# Patient Record
Sex: Female | Born: 1960 | Race: Black or African American | Hispanic: No | Marital: Single | Smoking: Light tobacco smoker
Health system: Southern US, Community
[De-identification: ages and names within clinical notes are randomized; demographics above are authoritative.]

## PROBLEM LIST (undated history)

## (undated) DIAGNOSIS — I10 Essential (primary) hypertension: Secondary | ICD-10-CM

## (undated) DIAGNOSIS — K219 Gastro-esophageal reflux disease without esophagitis: Secondary | ICD-10-CM

## (undated) DIAGNOSIS — E781 Pure hyperglyceridemia: Secondary | ICD-10-CM

## (undated) HISTORY — DX: Gastro-esophageal reflux disease without esophagitis: K21.9

## (undated) HISTORY — DX: Pure hyperglyceridemia: E78.1

## (undated) HISTORY — DX: Essential (primary) hypertension: I10

## (undated) HISTORY — PX: DILATION AND CURETTAGE OF UTERUS: SHX78

## (undated) HISTORY — PX: UMBILICAL HERNIA REPAIR: SHX196

## (undated) HISTORY — PX: TONSILLECTOMY: SHX5217

## (undated) HISTORY — PX: TUBAL LIGATION: SHX77

## (undated) HISTORY — PX: CHOLECYSTECTOMY: SHX55

---
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
## 1981-08-15 HISTORY — PX: CHOLECYSTECTOMY: SHX55

## 2014-08-15 DIAGNOSIS — M545 Low back pain, unspecified: Secondary | ICD-10-CM

## 2014-08-15 HISTORY — DX: Low back pain, unspecified: M54.50

## 2015-08-14 ENCOUNTER — Ambulatory Visit (INDEPENDENT_AMBULATORY_CARE_PROVIDER_SITE_OTHER): Payer: Worker's Compensation | Admitting: Family Medicine

## 2015-08-14 ENCOUNTER — Telehealth: Payer: Self-pay

## 2015-08-14 VITALS — BP 106/68 | HR 60 | Temp 97.6°F | Resp 16 | Ht 58.5 in | Wt 192.6 lb

## 2015-08-14 DIAGNOSIS — S39012A Strain of muscle, fascia and tendon of lower back, initial encounter: Secondary | ICD-10-CM

## 2015-08-14 DIAGNOSIS — M545 Low back pain, unspecified: Secondary | ICD-10-CM

## 2015-08-14 DIAGNOSIS — Y990 Civilian activity done for income or pay: Secondary | ICD-10-CM

## 2015-08-14 MED ORDER — METHOCARBAMOL 500 MG PO TABS: ORAL_TABLET | ORAL | Status: DC

## 2015-08-14 MED ORDER — NAPROXEN 500 MG PO TABS: 500.0000 mg | ORAL_TABLET | Freq: Two times a day (BID) | ORAL | Status: DC

## 2015-08-14 NOTE — Patient Instructions (Signed)
Take the muscle relaxant, methocarbamol, 1 in the morning, 1 in the afternoon, and 2 at bedtime  Take the Anti-inflammatory medication naproxen 500 mg one twice daily at breakfast and supper . If you need something stronger for pain in addition to this you can take Tylenol 500 mg 2 pills 3 times daily  Continue your blood pressure medication  Plan to return in about 4 days for a recheck, sooner if problems or worse.

## 2015-08-14 NOTE — Telephone Encounter (Signed)
Pt is requesting a note to be out of work.. I don't see instructions where she has been taken out of work... Only instrutions to return for re-check within 4 days. PT was advised to return to the office to discuss her request to be out of work with the provider (Dr. Clayborn Heron hours of availability were provided)////PT stated she will call back once she has more information from her employer.  [phone]

## 2015-08-14 NOTE — Progress Notes (Signed)
Patient ID: Mackenzie Garrett, female    DOB: [birth year]  Age: 54 y.o. MRN: [id number]  Chief Complaint  Patient presents with  . 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  She does not have any history of major back problems. This morning she called a bad out from the wall with patient on the bed. She felt immediate strain in her left low back. It hurt pretty significantly, and has continued hurting though maybe it has subsided a little bit. That was about 11:00, so it is now. About 3 hours. She has not had any major radiation of the pain. It just hurts in the left low back around the SI region.  Current allergies, medications, problem list, past/family and social histories reviewed.  Objective:  BP 106/68 mmHg  Pulse 60  Temp(Src) 97.6 F (36.4 C) (Oral)  Resp 16  Ht 4' 10.5" (1.486 m)  Wt 192 lb 9.6 oz (87.363 kg)  BMI 39.56 kg/m2  SpO2 98%  Alert and oriented. Abdomen soft without masses or tenderness. Spine is straight, nontender. No asymmetry noted of the paraspinous muscles. She is not tender on the right. She is not tender on the left until just above the left SI joint which seems to be the area of maximum tenderness, some tenderness down on into the joint area. Range of motion is satisfactory though tilted to the left, bending forward, and extending to cause pain. Rotation is fairly intact. Straight leg raising test negative but has a little pain on the left. Deep tendon reflexes trace and symmetrical. Gait normal.  Assessment & Plan:   Assessment: 1. Lumbar strain, initial encounter   2. Left-sided low back pain without sciatica   3. Work related injury       Plan: Low back strain which will be treated with anti-inflammatory pain relievers and muscle relaxants. She should just work light duty for the next few days until this rests. If there is none available she will need to be left off work for a few days.  No orders of  the defined types were placed in this encounter.    Meds ordered this encounter  Medications  . amLODipine (NORVASC) 10 MG tablet    Sig: Take 10 mg by mouth daily.  . benazepril (LOTENSIN) 10 MG tablet    Sig: Take 10 mg by mouth daily.  . naproxen (NAPROSYN) 500 MG tablet    Sig: Take 1 tablet (500 mg total) by mouth 2 (two) times daily with a meal.    Dispense:  30 tablet    Refill:  0  . methocarbamol (ROBAXIN) 500 MG tablet    Sig: Take 1 in the morning, 1 in the afternoon, and 2 at bedtime for muscle relaxant    Dispense:  30 tablet    Refill:  0         Patient Instructions  Take the muscle relaxant, methocarbamol, 1 in the morning, 1 in the afternoon, and 2 at bedtime  Take the Anti-inflammatory medication naproxen 500 mg one twice daily at breakfast and supper . If you need something stronger for pain in addition to this you can take Tylenol 500 mg 2 pills 3 times daily  Continue your blood pressure medication  Plan to return in about 4 days for a recheck, sooner if problems or worse.      Return in about 4 days (around 08/18/2015).   HOPPER,DAVID,  MD 08/14/2015

## 2015-08-18 ENCOUNTER — Ambulatory Visit (INDEPENDENT_AMBULATORY_CARE_PROVIDER_SITE_OTHER): Payer: Worker's Compensation | Admitting: Internal Medicine

## 2015-08-18 VITALS — BP 126/76 | HR 65 | Temp 98.4°F | Resp 18 | Ht <= 58 in | Wt 191.4 lb

## 2015-08-18 DIAGNOSIS — S39012D Strain of muscle, fascia and tendon of lower back, subsequent encounter: Secondary | ICD-10-CM

## 2015-08-18 NOTE — Progress Notes (Signed)
   Subjective:  By signing my name below, I, Moises Blood, attest that this documentation has been prepared under the direction and in the presence of Tami Lin, MD. Electronically Signed: Moises Blood, [place]. 08/18/2015 , 11:52 AM .  Patient was seen in Room 12 .   Patient ID: Mackenzie Garrett, female    DOB: March 22, 1961, 55 y.o.   MRN: [id number] Chief Complaint  Patient presents with  . Follow-up    for back pain   HPI Mackenzie Garrett is a 55 y.o. female who presents to [facility] for follow up on her back pain on worker's comp. She was seen by Dr. Linna Darner 4 days ago. She was pulling a bed out for a patient while at work. She felt an immediate strain in her left low back and continued to hurt through the day. She denies history of back problems.   Currently, she's feeling better but still feel the pain. It hurts when she gets out of bed sometimes. When she gets out of the car, it hurts getting out if she sat in the car for a long time. She's been taking her medications except the robaxin 2 at bed time because she already feels drowsy from the previous 2 tablets in the day.   She works as a Quarry manager at [employer]. Her work didn't have her scheduled into work last weekend even with her on restrictions.   Allergies  Allergen Reactions  . Erythromycin   . Penicillins   . Percocet [Oxycodone-Acetaminophen]    Review of Systems  Constitutional: Negative for fever, chills and fatigue.  Gastrointestinal: Negative for abdominal pain.  Musculoskeletal: Positive for back pain. Negative for myalgias, joint swelling, arthralgias, gait problem, neck pain and neck stiffness.  Skin: Negative for rash and wound.      Objective:   Physical Exam  Constitutional: She is oriented to person, place, and time. She appears well-developed and well-nourished. No distress.  HENT:  Head: Normocephalic and atraumatic.  Eyes: EOM are normal. Pupils are equal, round, and reactive to light.  Neck: Neck supple.    Cardiovascular: Normal rate.   Pulmonary/Chest: Effort normal. No respiratory distress.  Musculoskeletal: Normal range of motion.  Tender over left lumbosacral extending into left buttock, straight leg raise to 90 degrees on left produces discomfort, sensation and reflexes intact  Neurological: She is alert and oriented to person, place, and time.  Skin: Skin is warm and dry.  Psychiatric: She has a normal mood and affect. Her behavior is normal.  Nursing note and vitals reviewed.  BP 126/76 mmHg  Pulse 65  Temp(Src) 98.4 F (36.9 C) (Oral)  Resp 18  Ht 4\' 10"  (1.473 m)  Wt 191 lb 6.4 oz (86.818 kg)  BMI 40.01 kg/m2  SpO2 97%     Assessment & Plan:  I have completed the patient encounter in its entirety as documented by the scribe, with editing by me where necessary. Rafay Dahan P. Laney Pastor, M.D.  Lumbar strain, subsequent encounter  Ref PT Cont meds Work restrictions  Reck 2 weeks

## 2015-09-01 ENCOUNTER — Ambulatory Visit (INDEPENDENT_AMBULATORY_CARE_PROVIDER_SITE_OTHER): Payer: Worker's Compensation | Admitting: Family Medicine

## 2015-09-01 VITALS — BP 106/70 | HR 68 | Temp 98.2°F | Resp 16 | Ht <= 58 in | Wt 194.4 lb

## 2015-09-01 DIAGNOSIS — S39012D Strain of muscle, fascia and tendon of lower back, subsequent encounter: Secondary | ICD-10-CM | POA: Diagnosis not present

## 2015-09-01 MED ORDER — PREDNISONE 20 MG PO TABS: 20.0000 mg | ORAL_TABLET | Freq: Every day | ORAL | Status: DC

## 2015-09-01 NOTE — Patient Instructions (Signed)
I would like you to stop the Naprosyn and the muscle relaxer. I've ordered a new medicine which can pick up at the pharmacy which is taken once a day.  I would like to see you back in one week. I've written guidelines for work and started the process of referring him to a physical therapist.

## 2015-09-01 NOTE — Progress Notes (Signed)
Subjective:  This chart was scribed for Robyn Haber MD, by [name], at [facility].  This patient was seen in room 9 and the patient's care was started at 9:53 AM.    Patient ID: Mackenzie Garrett, female    DOB: 07/15/1961, 55 y.o.   MRN: [id number] Chief Complaint  Patient presents with   Follow-up    workers comp. injury    HPI  HPI Comments: Mackenzie Garrett is a 55 y.o. female who presents to the [facility] for a follow up regarding her lower back injury which occurred at work in December.  She took the medication (Naproxen and Methocarbamol) she was given here last visit but states that it made her very drowsy.  She has pain in her legs occasionally and feels pain in her back when she lifts her left leg upwards. She has not yet attended any rehab sessions nor has she set anything up.  She was put on weight restrictions but her work wants specific details on what she can and cannot do.  She has difficulty sleeping at times.  Patient takes ibuprofen very rarely.    She was seen here by Dr. Linna Darner and by Dr. Laney Pastor in the past.   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    There are no active problems to display for this patient.  Past Medical History  Diagnosis Date   Hypertension    Past Surgical History  Procedure Laterality Date   Cholecystectomy     Allergies  Allergen Reactions   Erythromycin    Penicillins    Percocet [Oxycodone-Acetaminophen]    Prior to Admission medications   Medication Sig Start Date End Date Taking? Authorizing Provider  amLODipine (NORVASC) 10 MG tablet Take 10 mg by mouth daily.   Yes Historical Provider, MD  benazepril (LOTENSIN) 10 MG tablet Take 10 mg by mouth daily.   Yes Historical Provider, MD  methocarbamol (ROBAXIN) 500 MG tablet Take 1 in the morning, 1 in the afternoon, and 2 at bedtime for muscle relaxant 08/14/15  Yes Posey Boyer,  MD  naproxen (NAPROSYN) 500 MG tablet Take 1 tablet (500 mg total) by mouth 2 (two) times daily with a meal. 08/14/15  Yes Posey Boyer, MD   Social History   Social History   Marital Status: Single    Spouse Name: N/A   Number of Children: N/A   Years of Education: N/A   Occupational History   Not on file.   Social History Main Topics   Smoking status: Current Every Day Smoker   Smokeless tobacco: Not on file   Alcohol Use: Not on file   Drug Use: Not on file   Sexual Activity: Not on file   Other Topics Concern   Not on file   Social History Narrative       Review of Systems  Constitutional: Negative for fever and chills.  Eyes: Negative for pain, redness and itching.  Respiratory: Negative for cough and shortness of breath.   Gastrointestinal: Negative for nausea and vomiting.  Musculoskeletal: Positive for back pain.  Neurological: Negative for syncope and speech difficulty.       Objective:   Physical Exam  Constitutional: She appears well-developed and well-nourished. No distress.  HENT:  Head: Normocephalic and atraumatic.  Eyes: Pupils are equal, round, and reactive to light.  Neck: Normal range of motion.  Pulmonary/Chest: Effort normal. No respiratory  distress.  Musculoskeletal:  Mild straight leg raise and positive on the left.   Skin: Skin is warm and dry.  Psychiatric: She has a normal mood and affect. Her behavior is normal.   Filed Vitals:   09/01/15 0935  BP: 106/70  Pulse: 68  Temp: 98.2 F (36.8 C)  TempSrc: Oral  Resp: 16  Height: 4\' 10"  (1.473 m)  Weight: 194 lb 6.4 oz (88.179 kg)  SpO2: 98%    Knee and ankle reflexes are normal. There is no muscle wasting.      Assessment & Plan:   This chart was scribed in my presence and reviewed by me personally.    ICD-9-CM ICD-10-CM   1. Lumbar strain, subsequent encounter V58.89 S39.012D predniSONE (DELTASONE) 20 MG tablet   847.2  Ambulatory referral to Physical  Therapy   Follow-up one week  Signed, Robyn Haber, MD

## 2015-09-02 ENCOUNTER — Telehealth: Payer: Self-pay

## 2015-09-02 NOTE — Telephone Encounter (Signed)
Waiting on payment of $12.75 for 17 pages from [facility].

## 2015-09-05 NOTE — Telephone Encounter (Signed)
Payment received and records faxed on 09/05/15

## 2015-09-08 ENCOUNTER — Ambulatory Visit (INDEPENDENT_AMBULATORY_CARE_PROVIDER_SITE_OTHER): Payer: Worker's Compensation | Admitting: Family Medicine

## 2015-09-08 VITALS — BP 128/88 | HR 71 | Temp 98.1°F | Ht <= 58 in | Wt 192.4 lb

## 2015-09-08 DIAGNOSIS — S39012D Strain of muscle, fascia and tendon of lower back, subsequent encounter: Secondary | ICD-10-CM

## 2015-09-08 DIAGNOSIS — M5442 Lumbago with sciatica, left side: Secondary | ICD-10-CM | POA: Diagnosis not present

## 2015-09-08 MED ORDER — MELOXICAM 15 MG PO TABS
15.0000 mg | ORAL_TABLET | Freq: Every day | ORAL | Status: DC
Start: 2015-09-08 — End: 2015-10-06

## 2015-09-08 NOTE — Patient Instructions (Addendum)
Multiple messages have been left with your claims adjuster who has not responded to our [employer] assistant. I think you need to go 3 your job and try and tell them to contact the Worker's Comp. Engineer, structural and get things in motion.  You still need physical therapy for your back.  Take meloxicam 15 mg 1 daily for pain and inflammation

## 2015-09-08 NOTE — Progress Notes (Signed)
Patient ID: Mackenzie Garrett, female    DOB: 06-11-1961  Age: 55 y.o. MRN: [id number]  Chief Complaint  Patient presents with  . Follow-up    for back pain - do not feel any better    Subjective:   54 year old lady who had a lumbar strain about 4 weeks ago. I initially saw her, subsequently she has been seen by Dr. Verline Lema and Dr. Laney Pastor. She was last treated with taper of steroids. Medicines have not seemed to help her a lot. She continues to hurt in her left lumbar region and down into the buttock and down the leg at times. She not been begun on physical therapy yet, has not heard back from anybody on that. It appears this the message is at the claims adjuster with nothing having been done about it yet. We will check on that.  Patient has been working sedentary work at the job place. She says the muscle relaxants made her drugged. She is off of them now.  Current allergies, medications, problem list, past/family and social histories reviewed.  Objective:  BP 128/88 mmHg  Pulse 71  Temp(Src) 98.1 F (36.7 C) (Oral)  Ht 4\' 10"  (1.473 m)  Wt 192 lb 6.4 oz (87.272 kg)  BMI 40.22 kg/m2  SpO2 98%  Obese female. Moves slowly. Abdomen soft. Straight leg raising test negative. Tender knot in the spine but in the left SI and buttock regions. Gait is adequate but slow  Assessment & Plan:   Assessment: 1. Lumbar strain, subsequent encounter   2. Left-sided low back pain with left-sided sciatica       Plan: Lumbar strain does not seem to be improving much. Medications have not helped. Awaiting physical therapy referral.  Time was spent calling the claims adjuster's. I Worker's Comp. Warehouse manager has been unable to reach the patient's Worker's Comp. Engineer, structural, the English as a second language teacher, and now has a message in to the supervisor.  No orders of the defined types were placed in this encounter.    Meds ordered this encounter  Medications  . meloxicam (MOBIC) 15 MG tablet     Sig: Take 1 tablet (15 mg total) by mouth daily.    Dispense:  30 tablet    Refill:  0         Patient Instructions  Multiple messages have been left with your claims adjuster who has not responded to our [employer] assistant. I think you need to go 3 your job and try and tell them to contact the Worker's Comp. Engineer, structural and get things in motion.  You still need physical therapy for your back.  Take meloxicam 15 mg 1 daily for pain and inflammation     No Follow-up on file.   HOPPER,DAVID, MD 09/08/2015

## 2015-09-11 DIAGNOSIS — Z0271 Encounter for disability determination: Secondary | ICD-10-CM

## 2015-09-22 ENCOUNTER — Ambulatory Visit (INDEPENDENT_AMBULATORY_CARE_PROVIDER_SITE_OTHER): Payer: Worker's Compensation | Admitting: Family Medicine

## 2015-09-22 VITALS — BP 128/88 | HR 85 | Temp 99.0°F | Resp 16 | Ht <= 58 in | Wt 199.0 lb

## 2015-09-22 DIAGNOSIS — S39012D Strain of muscle, fascia and tendon of lower back, subsequent encounter: Secondary | ICD-10-CM | POA: Diagnosis not present

## 2015-09-22 DIAGNOSIS — M545 Low back pain, unspecified: Secondary | ICD-10-CM

## 2015-09-22 MED ORDER — PREDNISONE 20 MG PO TABS: ORAL_TABLET | ORAL | Status: DC

## 2015-09-22 NOTE — Progress Notes (Signed)
Patient ID: Mackenzie Garrett, female    DOB: Jan 17, 1961  Age: 55 y.o. MRN: [id number]  Chief Complaint  Patient presents with  . Back Pain    injury at work     Subjective:   Patient continues to have a lot of trouble with pain in her low back and left low back. The pain goes down her left leg Toward the knee. She did finally get into physical therapy, had her first time yesterday. She is going to be seeing them twice a week. She is working light duty.  Current allergies, medications, problem list, past/family and social histories reviewed.  Objective:  BP 128/88 mmHg  Pulse 85  Temp(Src) 99 F (37.2 C) (Oral)  Resp 16  Ht 4\' 10"  (1.473 m)  Wt 199 lb (90.266 kg)  BMI 41.60 kg/m2  SpO2 98%  Continues with pain in her back. Walks very slowly with a limp favoring the left leg significantly. She is tender in the low midline of her back at about the L4 to S1 segments. She hurts in the left paraspinous muscles to the left buttock. Significant pain on flexion, extension, and right tilt.  Assessment & Plan:   Assessment: 1. Lumbar strain, subsequent encounter   2. Low back pain potentially associated with radiculopathy       Plan: Low back strain and pain and radiculopathy. Was going to retreat with a higher dose of prednisone, but she says that she didn't tolerate them with pain in her stomach.  No orders of the defined types were placed in this encounter.    Meds ordered this encounter  Medications  . predniSONE (DELTASONE) 20 MG tablet    Sig: Take 3 daily for 2 days, then 2 daily for 2 days, then 1 daily for 2 days. Take after breakfast.    Dispense:  12 tablet    Refill:  0         Patient Instructions  Continue physical therapy  Continue light duty at work  Continue to try and walk more and get more active. Keep doing the exercises that physical therapy teaches you.  Return in 2 weeks     Return in about 2 weeks (around 10/06/2015).   Paulanthony Gleaves, MD  09/22/2015

## 2015-09-22 NOTE — Patient Instructions (Signed)
Continue physical therapy  Continue light duty at work  Continue to try and walk more and get more active. Keep doing the exercises that physical therapy teaches you.  Return in 2 weeks

## 2015-10-06 ENCOUNTER — Ambulatory Visit: Payer: Worker's Compensation

## 2015-10-06 ENCOUNTER — Ambulatory Visit (INDEPENDENT_AMBULATORY_CARE_PROVIDER_SITE_OTHER): Payer: Worker's Compensation | Admitting: Family Medicine

## 2015-10-06 VITALS — BP 132/80 | HR 75 | Temp 98.1°F | Resp 16 | Ht <= 58 in | Wt 197.0 lb

## 2015-10-06 DIAGNOSIS — M545 Low back pain, unspecified: Secondary | ICD-10-CM

## 2015-10-06 DIAGNOSIS — M5442 Lumbago with sciatica, left side: Secondary | ICD-10-CM | POA: Diagnosis not present

## 2015-10-06 DIAGNOSIS — S39012D Strain of muscle, fascia and tendon of lower back, subsequent encounter: Secondary | ICD-10-CM

## 2015-10-06 MED ORDER — MELOXICAM 15 MG PO TABS
15.0000 mg | ORAL_TABLET | Freq: Every day | ORAL | Status: DC
Start: 2015-10-06 — End: 2015-10-27

## 2015-10-06 NOTE — Progress Notes (Signed)
Patient ID: Mackenzie Garrett, female    DOB: 28-Sep-1960  Age: 55 y.o. MRN: [id number]  Chief Complaint  Patient presents with  . Follow-up    workers comp  . Back Pain    states she is doing better but still having pain    Subjective:   Patient continues to have problems with pain in her left low back down into the buttock and thigh sometimes. She has begun physical therapy for a couple weeks now. It is helped a little bit. She has done some activities at the job, but things like standing at [employer] for waiting for copies to come out causes her pain to be worse. She has not had any new injuries. She tried the prednisone again but could not tolerate taking the full course of it. She goes to PT twice a week. She does some light activity around home but lately is more than she sits.   They have not had true light duty for her. She has continued doing many of her previous tests, requires lots of walking on both floors. 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 She is only had a few tasks that were desk type sedentary activity. The stuff she is doing keeps make her back hurt worse.  Current allergies, medications, problem list, past/family and social histories reviewed.  Objective:  BP 132/80 mmHg  Pulse 75  Temp(Src) 98.1 F (36.7 C) (Oral)  Resp 16  Ht 4\' 10"  (1.473 m)  Wt 197 lb (89.359 kg)  BMI 41.18 kg/m2  SpO2 98%  Alert and oriented. Is able to walk satisfactorily but slow and cautiously. She has tenderness in the left low back and down into the upper part of the buttock. She is nontender along the spine. The right-sided back seems normal. Deep tendon reflexes are essentially absent in her knees and ankles. She has negative straight leg raising tests seated. On laying down straight leg raising test causes pain in the back but nonradicular, able to go up to 90.  Assessment & Plan:   Assessment: No diagnosis found.    Plan: Persistent low back strain, needs to continue on  physical therapy. Will proceed on to getting some LS spine x-rays. They need to make referral to a back specialist. X-rays are normal by my interpretation and the radiologist just notes minimal degenerative changes. More changes at L5-S1, which is also a fairly normal process.  Will try and make a referral to back specialist to see if they have any other input. No orders of the defined types were placed in this encounter.    No orders of the defined types were placed in this encounter.         There are no Patient Instructions on file for this visit.   No Follow-up on file.   Mateja Dier, MD 10/06/2015

## 2015-10-06 NOTE — Patient Instructions (Addendum)
Because you received an x-ray today, you will receive an invoice from [facility]. Please contact [facility] at [phone] with questions or concerns regarding your invoice. Our billing staff will not be able to assist you with those questions.   Continue physical therapy.  Referral is being made to orthopedic back specialist when we can get it approved through [employer]  Return in 3 weeks, sooner if needed

## 2015-10-27 ENCOUNTER — Ambulatory Visit (INDEPENDENT_AMBULATORY_CARE_PROVIDER_SITE_OTHER): Payer: Worker's Compensation | Admitting: Family Medicine

## 2015-10-27 VITALS — BP 122/78 | HR 73 | Temp 97.9°F | Resp 16 | Ht 59.0 in | Wt 199.0 lb

## 2015-10-27 DIAGNOSIS — S39012D Strain of muscle, fascia and tendon of lower back, subsequent encounter: Secondary | ICD-10-CM | POA: Diagnosis not present

## 2015-10-27 DIAGNOSIS — M5442 Lumbago with sciatica, left side: Secondary | ICD-10-CM | POA: Diagnosis not present

## 2015-10-27 MED ORDER — MELOXICAM 15 MG PO TABS: 15.0000 mg | ORAL_TABLET | Freq: Every day | ORAL | Status: DC

## 2015-10-27 NOTE — Progress Notes (Signed)
Subjective:  This chart was scribed for Mackenzie Garrett, by [name], at [facility].  This patient was seen in room 2 and the patient's care was started at 11:53 AM.    Patient ID: Mackenzie Garrett, female    DOB: [birth year], 55 y.o.   MRN: [id number] Chief Complaint  Patient presents with  . Follow-up  . Back Pain    HPI  HPI Comments: Mackenzie Garrett is a 55 y.o. female who presents to the [facility] for a follow up of lower back pain due to an injury at work. Works as a Technical brewer at [employer].   Date of injury was 08/14/15 Left lower back strain diagnosed initially. She was treated with Naprosyn 500 mg BID and robaxin.  Was also placed on work restrictions.   Jan 3rd follow up: Feeling better but was still feeling pain. She was drowsy form rhobaxin so she was no longer taking 2 at bed time. Continued on restrictions and referred to PT. Re-checked  Recheck January 17th: She had not seen PT yet at that time. She had pain in legs occasionally still with pain in the back at that time. She was treated with prednisone with a plan to follow up in one week.   Recheck January 24th.  Per that note, medicines had not seemed to help her a lot and she still had pain in her left lumbar area down into the buttock and leg at times. She had not yet seen physical therapy yet at that time. Stopped muscle relaxant due to sedation and was on sedentary work. Plan was still to pursue physical therapy but started Meloxicam 15 mg QD.   Recheck feb 7th, continued pain in left lower back and left lower leg. Discussed higher dose of prednisone but per that note, she did not tolerate due to discomfort in her stomach. Appears she was prescribed prednisone taper again. She did have 1 episode of PT at that visit with plan of seeing them twice a week.   Feb 21st  follow up: Some improvement with physical therapy. Reportedly did not complete the prednisone taper due to  intolerance but undergoing physical therapy twice a week.  Per that note, still completing some tasks at her work with complaints of these activities making her back worse. X ray indicated minimal degenerative changes in the lumbar spine. And was referred to ortho/ back specialist.   She states that her back pain has not changed and she has not received a call about an appointment with orthopedics.  She states that the pain is more on her left lower back to her left buttock and she has less symptoms of pain shooting all the way down her leg but states it does shoot down to her thigh at times.  She feels slightly better (30--40%) after going to physical therapy but states that it starts again shortly after.  She was put on light duty at work in the beginning but is no longer at work as they only allowed her to do this for 45 days. Denies any bowel or bladder incontinence, no weakness. She is complaint with her meloxicam but is unsure if it is fully helping her. She has been to her PT session twice a week for a couple weeks.   Approximately 15 minutes of record review prior to seeing patient.    There are no active problems to display for this patient.  Past Medical History  Diagnosis Date  .  Hypertension    Past Surgical History  Procedure Laterality Date  . Cholecystectomy     Allergies  Allergen Reactions  . Erythromycin   . Penicillins   . Percocet [Oxycodone-Acetaminophen]    Prior to Admission medications   Medication Sig Start Date End Date Taking? Authorizing Provider  amLODipine (NORVASC) 10 MG tablet Take 10 mg by mouth daily.   Yes Historical Provider, Garrett  benazepril (LOTENSIN) 10 MG tablet Take 10 mg by mouth daily.   Yes Historical Provider, Garrett  meloxicam (MOBIC) 15 MG tablet Take 1 tablet (15 mg total) by mouth daily. 10/06/15  Yes Posey Boyer, Garrett   Social History   Social History  . Marital Status: Single    Spouse Name: N/A  . Number of Children: N/A  . Years of  Education: N/A   Occupational History  . Not on file.   Social History Main Topics  . Smoking status: Current Every Day Smoker -- 0.40 packs/day for 34 years    Types: Cigarettes  . Smokeless tobacco: Not on file  . Alcohol Use: Not on file  . Drug Use: Not on file  . Sexual Activity: Not on file   Other Topics Concern  . Not on file   Social History Narrative    Review of Systems  Constitutional: Negative for fever and chills.  Eyes: Negative for pain, redness and itching.  Respiratory: Negative for cough and shortness of breath.   Gastrointestinal: Negative for nausea and vomiting.  Genitourinary: Negative for dysuria, frequency and difficulty urinating.  Musculoskeletal: Positive for back pain.  Neurological: Negative for seizures, syncope and speech difficulty.       Objective:   Physical Exam  Constitutional: She is oriented to person, place, and time. She appears well-developed and well-nourished. No distress.  HENT:  Head: Normocephalic and atraumatic.  Eyes: Pupils are equal, round, and reactive to light.  Neck: Normal range of motion.  Pulmonary/Chest: Effort normal. No respiratory distress.  Musculoskeletal:  No mid line bony tenderness  Tender along the left paraspinals  minimal spasm, primarily just tenderness.  Slight tenderness along the left buttock and sciatic notch.  Flexion to approximately 45 degrees.  Minimal extension.  Guarded with right lateral flexion and left lateral flexion.  Equal rotation but again slightly decreased. guarded but is able to hell and toe walk. Negative straight leg raise with complaints of lower back pain on left side only.   Neurological: She is alert and oriented to person, place, and time. She displays no Babinski's sign on the right side. She displays no Babinski's sign on the left side.  Reflex Scores:      Patellar reflexes are 2+ on the right side and 2+ on the left side.      Achilles reflexes are 2+ on the right  side and 2+ on the left side. Skin: Skin is warm and dry.  Psychiatric: She has a normal mood and affect. Her behavior is normal.    Filed Vitals:   10/27/15 1107  BP: 122/78  Pulse: 73  Temp: 97.9 F (36.6 C)  Resp: 16  Height: 4\' 11"  (1.499 m)  Weight: 199 lb (90.266 kg)         Assessment & Plan:   Brentley Ballantine is a 55 y.o. female Lumbar strain, subsequent encounter - Plan: meloxicam (MOBIC) 15 MG tablet  Left-sided low back pain with left-sided sciatica - Plan: meloxicam (MOBIC) 15 MG tablet L low back strain with intermittent sciatic symptoms  due to injury at work as above. Persistent pain with only transient relief with PT. Ortho eval pending. No appreciated weakness and reflexes intact/equal.   -Minimal relief with prednisone, so will hold on repeating dose at this time.   -continue meloxicam.   -continue physical therapy  -continue restrictions at work - see letter.   -follow up in next 2 weeks if she has not yet seen ortho. Sooner if worse. rtc precautions.  Meds ordered this encounter  Medications  . meloxicam (MOBIC) 15 MG tablet    Sig: Take 1 tablet (15 mg total) by mouth daily.    Dispense:  30 tablet    Refill:  0   Patient Instructions  IF you received an x-ray today, you will receive an invoice from [facility]. Please contact [facility] at [phone] with questions or concerns regarding your invoice.   IF you received labwork today, you will receive an invoice from [facility]. Please contact [facility] at [phone] with questions or concerns regarding your invoice.   Our billing staff will not be able to assist you with questions regarding bills from these companies.  You will be contacted with the lab results as soon as they are available. The fastest way to get your results is to activate your My Chart account. Instructions are located on the last page of this paperwork. If you have not heard from [place]  regarding the results in 2 weeks, please contact this office.  Okay to continue the meloxicam for now as it may be providing some benefit for pain and inflammation, but if you do have side effects with it, let us know. Continue physical therapy, and plan on orthopedic evaluation. I agree with previous evaluation to see orthopedist. Follow-up with [place] in the next 2 weeks if you have not yet seen orthopedics.  If symptoms are not improved by that time, I would consider ordering an MRI of your back. Return here sooner if worsening, or the emergency room if needed.  Return to the clinic or go to the nearest emergency room if any of your symptoms worsen or new symptoms occur.       I personally performed the services described in this documentation, which was scribed in my presence. The recorded information has been reviewed and considered, and addended by me as needed.

## 2015-10-27 NOTE — Patient Instructions (Addendum)
IF you received an x-ray today, you will receive an invoice from [facility]. Please contact [facility] at [phone] with questions or concerns regarding your invoice.   IF you received labwork today, you will receive an invoice from [facility]. Please contact [facility] at [phone] with questions or concerns regarding your invoice.   Our billing staff will not be able to assist you with questions regarding bills from these companies.  You will be contacted with the lab results as soon as they are available. The fastest way to get your results is to activate your My Chart account. Instructions are located on the last page of this paperwork. If you have not heard from [place] regarding the results in 2 weeks, please contact this office.  Okay to continue the meloxicam for now as it may be providing some benefit for pain and inflammation, but if you do have side effects with it, let us know. Continue physical therapy, and plan on orthopedic evaluation. I agree with previous evaluation to see orthopedist. Follow-up with [place] in the next 2 weeks if you have not yet seen orthopedics.  If symptoms are not improved by that time, I would consider ordering an MRI of your back. Return here sooner if worsening, or the emergency room if needed.  Return to the clinic or go to the nearest emergency room if any of your symptoms worsen or new symptoms occur.

## 2015-11-10 ENCOUNTER — Ambulatory Visit (INDEPENDENT_AMBULATORY_CARE_PROVIDER_SITE_OTHER): Payer: Worker's Compensation | Admitting: Physician Assistant

## 2015-11-10 VITALS — BP 125/83 | HR 81 | Temp 98.3°F | Resp 16 | Ht <= 58 in | Wt 199.0 lb

## 2015-11-10 DIAGNOSIS — S39012D Strain of muscle, fascia and tendon of lower back, subsequent encounter: Secondary | ICD-10-CM

## 2015-11-10 DIAGNOSIS — M5442 Lumbago with sciatica, left side: Secondary | ICD-10-CM

## 2015-11-10 NOTE — Patient Instructions (Signed)
     IF you received an x-ray today, you will receive an invoice from [facility]. Please contact [facility] at [phone] with questions or concerns regarding your invoice.   IF you received labwork today, you will receive an invoice from [facility]. Please contact [facility] at [phone] with questions or concerns regarding your invoice.   Our billing staff will not be able to assist you with questions regarding bills from these companies.  You will be contacted with the lab results as soon as they are available. The fastest way to get your results is to activate your My Chart account. Instructions are located on the last page of this paperwork. If you have not heard from us regarding the results in 2 weeks, please contact this office.      

## 2015-11-10 NOTE — Progress Notes (Signed)
[facility] [address] [place] [address] [phone]  Date:  11/10/2015   Name:  Mackenzie Garrett   DOB:  03/15/1961   MRN:  [id number]  PCP:  Andria Frames, MD    Chief Complaint: Follow-up   History of Present Illness:  This is a 55 y.o. female who is presentingfor a follow up of lower back pain due to an injury at work. 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 Left lower back strain diagnosed initially. She was treated with Naprosyn 500 mg BID and robaxin. Was also placed on work restrictions.   Jan 3rd follow up: Feeling better but was still feeling pain. She was drowsy form robaxin so she was no longer taking 2 at bed time. Continued on restrictions and referred to PT.   Recheck January 17th: She had not seen PT yet at that time. She had pain in legs occasionally still with pain in the back at that time. She was treated with prednisone with a plan to follow up in one week.  Recheck January 24th. Per that note, medicines had not seemed to help her a lot and she still had pain in her left lumbar area down into the buttock and leg at times. She had not yet seen physical therapy at that time. Stopped muscle relaxant due to sedation and was on sedentary work. Plan was still to pursue physical therapy but started Meloxicam 15 mg QD.   Recheck feb 7th, continued pain in left lower back and left lower leg. Discussed higher dose of prednisone but per that note, she did not tolerate due to discomfort in her stomach. Appears she was prescribed prednisone taper again. She did have 1 episode of PT at that visit with plan of seeing them twice a week.   Feb 21st follow up: Some improvement with physical therapy. Reportedly did not complete the prednisone taper due to intolerance but undergoing physical therapy twice a week. Per that note, still completing some tasks at her work with complaints of these activities making her back worse. X ray indicated  minimal degenerative changes in the lumbar spine. And was referred to ortho/ back specialist.   March 14th follow up: Back pain not improved and had still not heard about referral to ortho. Still with shooting pain into her left leg, but was only into thigh and no longer going down into foot. She was on light duty in the beginning but out of work at that time since work did not allow her to do light duty for longer than 45 days. She was tolerating meloxicam well but was unsure if helping. She was continuing to do PT twice a week.  Today she is reporting back pain is about the same. Sciatica is now down to left lower buttock. States she is ok for a time but if she bends over it will start all over again. She is finished with her 12 sessions of PT now. Still has not heard about her referral to ortho. She feels if she goes back to work now she will be in bad shape within a day or two.   Review of Systems:  Review of Systems See HPI  There are no active problems to display for this patient.   Prior to Admission medications   Medication Sig Start Date End Date Taking? Authorizing Provider  amLODipine (NORVASC) 10 MG tablet Take 10 mg by mouth daily.   Yes Historical Provider, MD  benazepril (LOTENSIN) 10 MG tablet Take 10 mg by mouth daily.   Yes Historical Provider, MD  meloxicam (MOBIC) 15 MG tablet Take 1 tablet (15 mg total) by mouth daily. 10/27/15  Yes Wendie Agreste, MD    Allergies  Allergen Reactions  . Erythromycin   . Penicillins   . Percocet [Oxycodone-Acetaminophen]     Past Surgical History  Procedure Laterality Date  . Cholecystectomy      Social History  Substance Use Topics  . Smoking status: Current Every Day Smoker -- 0.40 packs/day for 34 years    Types: Cigarettes  . Smokeless tobacco: None  . Alcohol Use: None    Family History  Problem Relation Age of Onset  . Diabetes Mother   . Kidney failure Mother     Medication list has been reviewed and  updated.  Physical Examination:  Physical Exam  Constitutional: She is oriented to person, place, and time. She appears well-developed and well-nourished. No distress.  HENT:  Head: Normocephalic and atraumatic.  Right Ear: Hearing normal.  Left Ear: Hearing normal.  Nose: Nose normal.  Eyes: Conjunctivae and lids are normal. Right eye exhibits no discharge. Left eye exhibits no discharge. No scleral icterus.  Pulmonary/Chest: Effort normal. No respiratory distress.  Musculoskeletal: Normal range of motion.       Lumbar back: She exhibits tenderness. She exhibits normal range of motion.       Back:  Straight leg raise positive on the left Strength and sensation intact  Neurological: She is alert and oriented to person, place, and time. She has normal reflexes.  Skin: Skin is warm, dry and intact. No lesion and no rash noted.  Psychiatric: She has a normal mood and affect. Her speech is normal and behavior is normal. Thought content normal.   BP 125/83 mmHg  Pulse 81  Temp(Src) 98.3 F (36.8 C)  Resp 16  Ht 4\' 10"  (1.473 m)  Wt 199 lb (90.266 kg)  BMI 41.60 kg/m2  Assessment and Plan:  1. Lumbar strain, subsequent encounter 2. Left-sided low back pain with left-sided sciatica Only mild improvement in the past 2 weeks. She is done with PT sessions now. She was referred to ortho over 4 weeks ago and still has not heard about referral from her w/c claim adjustor. We have sent referral to them and on 3/7 contacted them asking for an update and still have not heard anything. She will continue with mobic. Continue with either light duty or OOW, per company protocol. Follow up with ortho or return here on 11/27/15 if still has not been seen.   Benjaman Pott Drenda Freeze, MHS [facility]  11/10/2015

## 2015-11-13 ENCOUNTER — Telehealth: Payer: Self-pay | Admitting: Radiology

## 2015-11-13 NOTE — Telephone Encounter (Signed)
At the patient's request, I made a copy of her x ray on CD, and placed in the pick up drawer.  

## 2017-04-22 IMAGING — CR DG LUMBAR SPINE COMPLETE 4+V
5 series · 5 of 5 positions shown · non-contrast
Comparison: None.

CLINICAL DATA: Back pain for 2 months, no history of trauma

EXAM:
LUMBAR SPINE - COMPLETE 4+ VIEW

[AP (1 of 2)]
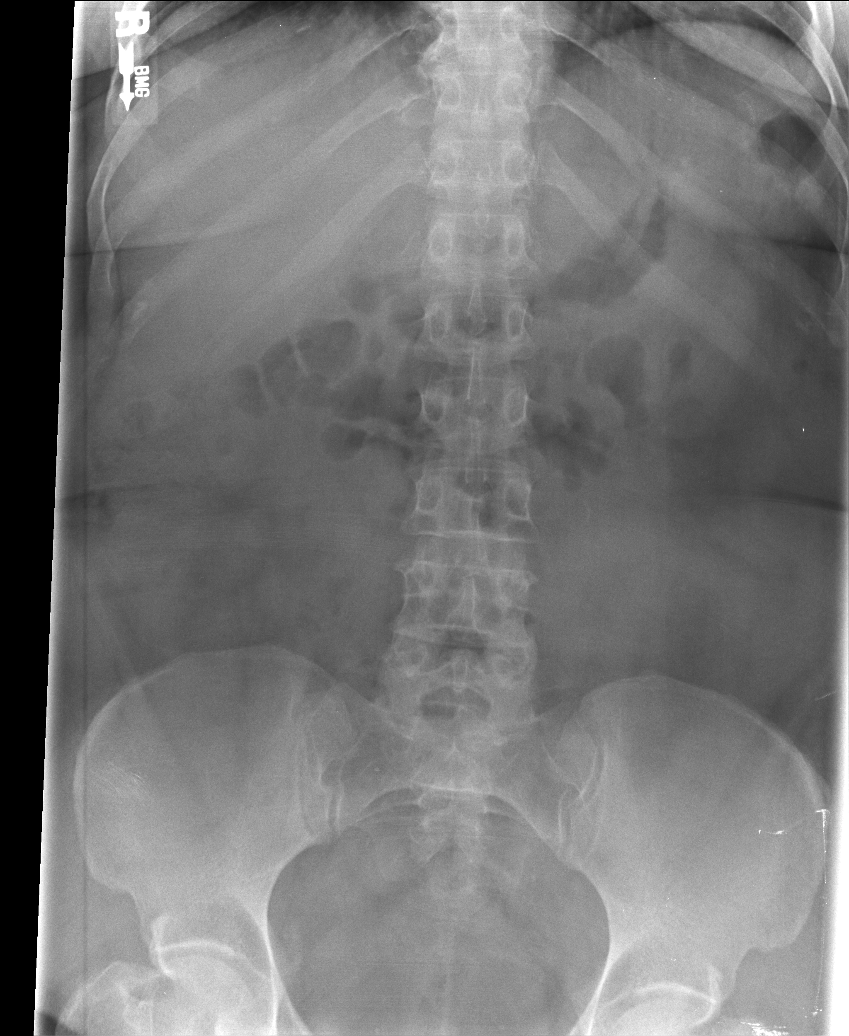

[AP (2 of 2)]
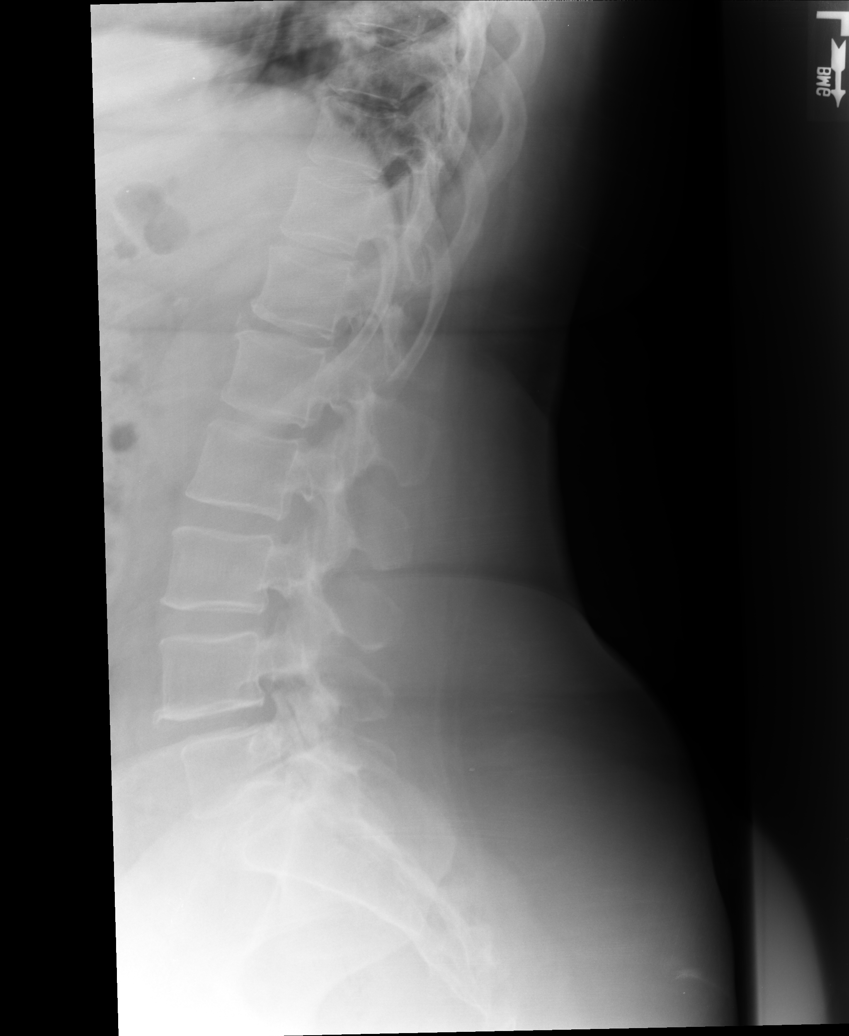

[l5 s1]
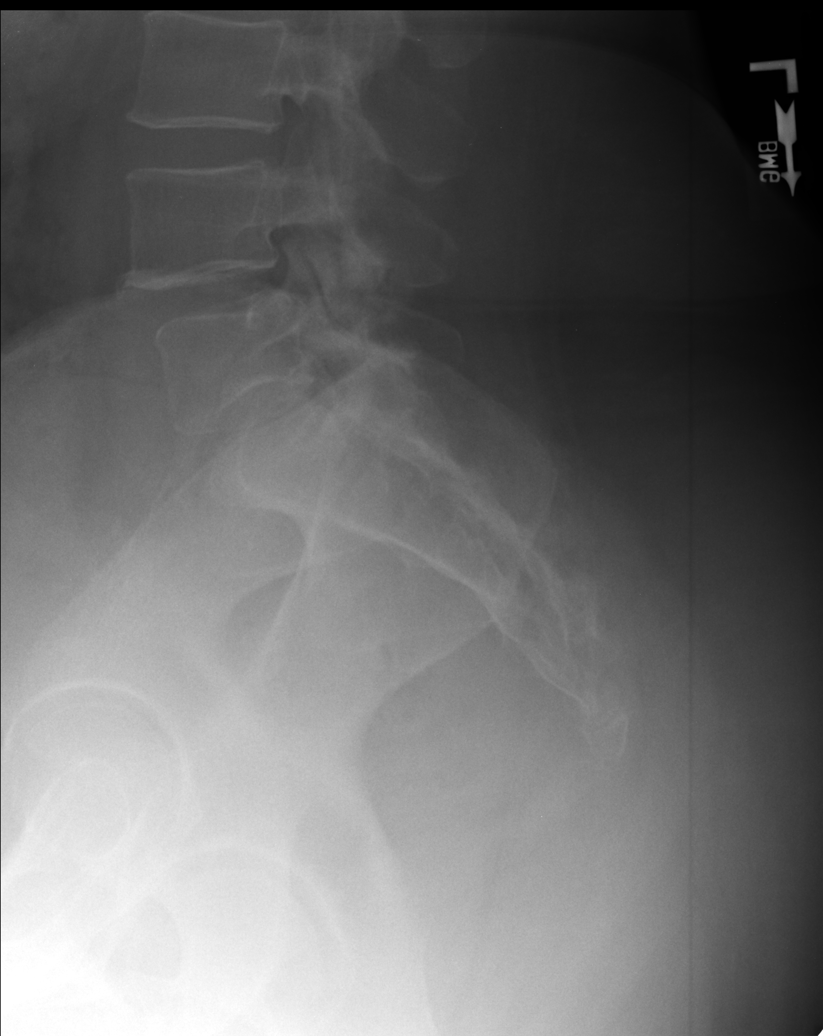

[rpo]
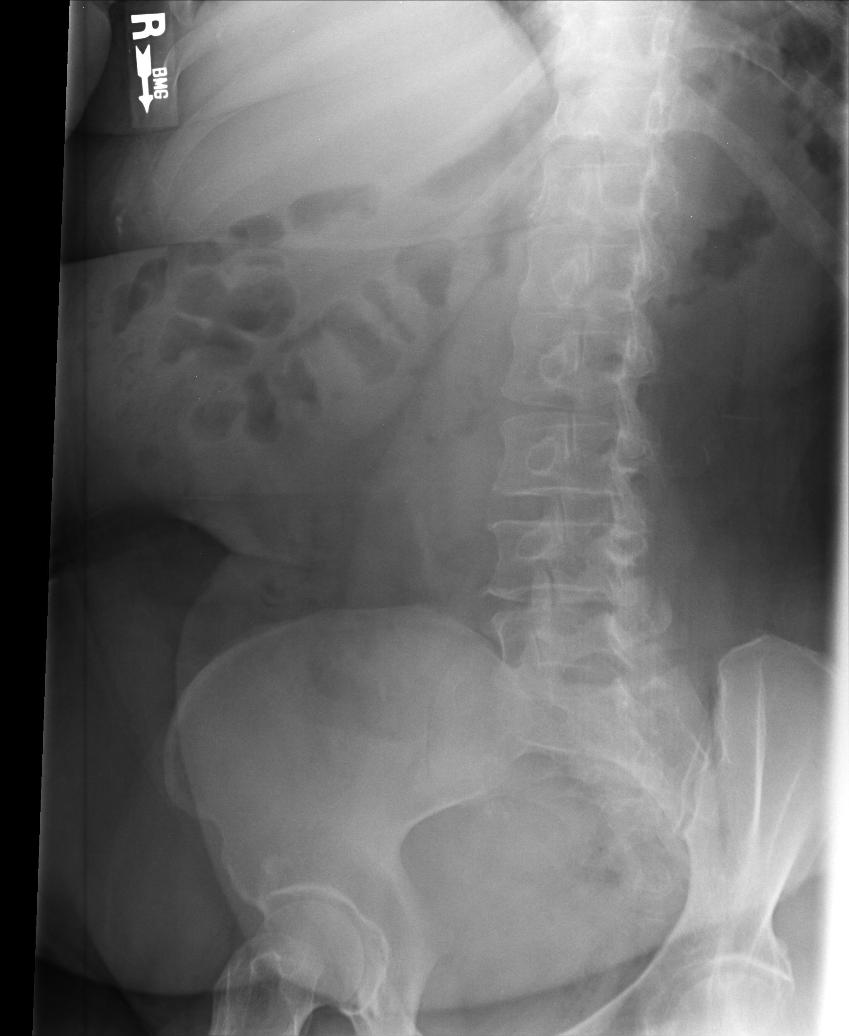

[lpo]
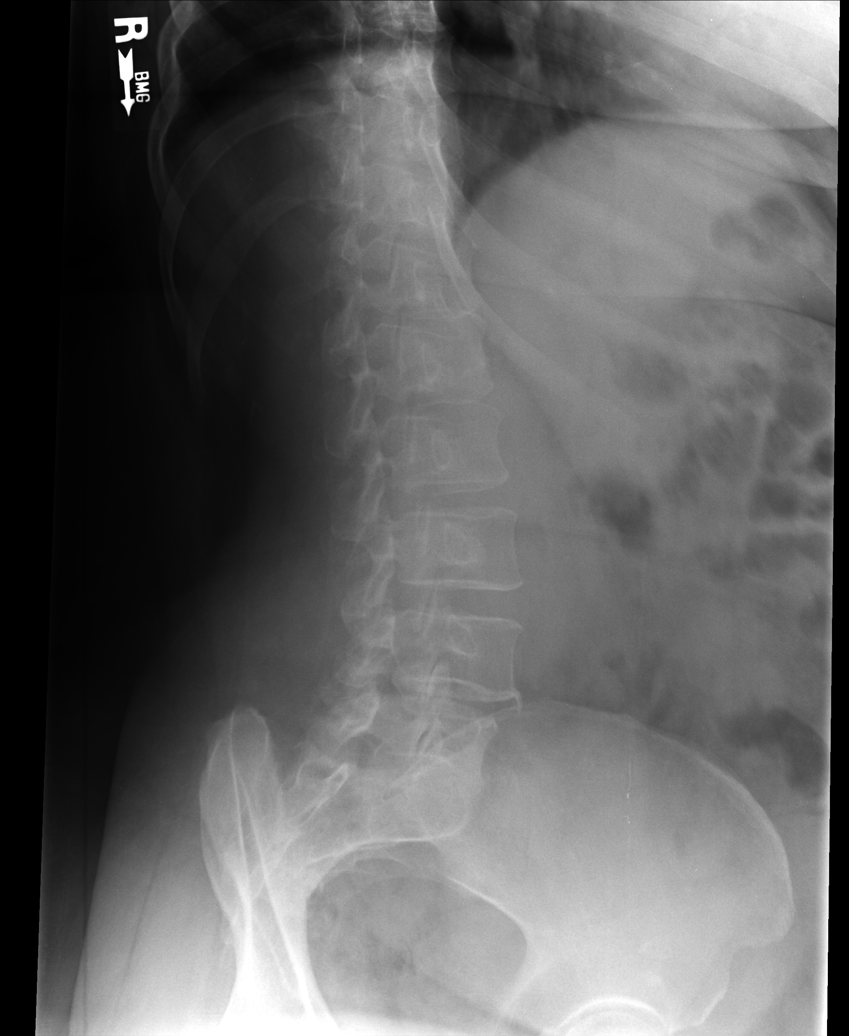

[5 of 5 positions shown; findings below may reference images not displayed]

FINDINGS: The lumbar vertebrae are in normal alignment. There is mild
degenerative disc disease at L4-5 where there is slight loss of disc
space and sclerosis with spurring. The remainder of intervertebral
disc spaces appear normal. There is degenerative change also noted
involving the facet joints at L5-S1. The SI joints are corticated.
The bowel gas pattern is nonspecific.
IMPRESSION: 1. Mild degenerative disc disease at L4-5.  Normal alignment.
2. Degenerative change involves the facet joints of L5-S1.

## 2018-02-21 ENCOUNTER — Ambulatory Visit: Payer: Self-pay

## 2019-06-06 ENCOUNTER — Other Ambulatory Visit: Payer: Self-pay

## 2019-06-06 DIAGNOSIS — Z20822 Contact with and (suspected) exposure to covid-19: Secondary | ICD-10-CM

## 2019-06-08 LAB — NOVEL CORONAVIRUS, NAA: SARS-CoV-2, NAA: NOT DETECTED

## 2019-10-23 ENCOUNTER — Other Ambulatory Visit: Payer: Self-pay

## 2019-10-23 ENCOUNTER — Ambulatory Visit: Payer: Self-pay | Admitting: Internal Medicine

## 2019-10-23 ENCOUNTER — Encounter: Payer: Self-pay | Admitting: Internal Medicine

## 2019-10-23 VITALS — BP 132/84 | HR 72 | Resp 14 | Ht <= 58 in | Wt 221.0 lb

## 2019-10-23 DIAGNOSIS — E781 Pure hyperglyceridemia: Secondary | ICD-10-CM

## 2019-10-23 DIAGNOSIS — I10 Essential (primary) hypertension: Secondary | ICD-10-CM | POA: Insufficient documentation

## 2019-10-23 DIAGNOSIS — G8929 Other chronic pain: Secondary | ICD-10-CM

## 2019-10-23 DIAGNOSIS — M545 Low back pain, unspecified: Secondary | ICD-10-CM | POA: Insufficient documentation

## 2019-10-23 MED ORDER — BENAZEPRIL HCL 10 MG PO TABS: 10.0000 mg | ORAL_TABLET | Freq: Every day | ORAL | 3 refills | Status: DC

## 2019-10-23 MED ORDER — AMLODIPINE BESYLATE 10 MG PO TABS: 10.0000 mg | ORAL_TABLET | Freq: Every day | ORAL | 3 refills | Status: DC

## 2019-10-23 MED ORDER — FENOFIBRATE 145 MG PO TABS: 145.0000 mg | ORAL_TABLET | Freq: Every day | ORAL | 3 refills | Status: DC

## 2019-10-23 NOTE — Progress Notes (Addendum)
Subjective:    Patient ID: Mackenzie Garrett, female   DOB: [birth year], 59 y.o.   MRN: [id number]   HPI   Here to establish  1.  Hypertension:  Diagnosed 10 years ago.  Controlled much of the time.    2.  Hypertriglyceridemia:  Taking Fenofibrate 145 mg daily for 3 months.  Has not had follow up [place] or liver enzymes. Not really working on how she eats or physical activity.  3.  Right low back pain:  Injured with work/patient care.  She was going to [employer].  Went through PT, but does not really feel it helped.  Has a history of left low back pain and feels this is what they focused on.    Current Meds  Medication Sig  . amLODipine (NORVASC) 10 MG tablet Take 10 mg by mouth daily.  . benazepril (LOTENSIN) 10 MG tablet Take 10 mg by mouth daily.  . fenofibrate (TRICOR) 145 MG tablet Take 145 mg by mouth daily.  . Lido-Capsaicin-Men-Methyl Sal (LIDOPRO) 4-.324-06-10.5 % OINT LidoPro 4 %-27.5 %-0.0325 % topical ointment  apply to affected area TID prn pain   Allergies  Allergen Reactions  . Erythromycin   . Naproxen Nausea Only  . Penicillins   . Percocet [Oxycodone-Acetaminophen]   . Atorvastatin Anxiety   Past Medical History:  Diagnosis Date  . Hypertension   . Hypertriglyceridemia   . Low back pain 2016   2 injuries:  left on first and right on second.  States MR showed bulging disc.  Second injury 10/2018    Past Surgical History:  Procedure Laterality Date  . [place]  . CHOLECYSTECTOMY  1983  . UMBILICAL HERNIA REPAIR     When a baby    Family History  Problem Relation Age of Onset  . Diabetes Mother   . Kidney failure Mother        Due to DM  . Hypertension Mother   . Hyperlipidemia Mother   . Stroke Mother   . Seizures Brother   . Multiple sclerosis Sister   . Cancer Sister        Liver  . Seizures Brother   . COPD Brother   . Diabetes Brother   . Heart disease Brother   . Migraines Daughter   . Asthma Daughter   .  Congenital heart disease Daughter        ?ASD or VSD--closed on its own.  . Polycystic ovary syndrome Daughter     Social History   Socioeconomic History  . Marital status: Single    Spouse name: Not on file  . Number of children: 4  . Years of education: 73  . Highest education level: Not on file  Occupational History  . Occupation: CNA    Comment: No work due to chronic back issues.  Tobacco Use  . Smoking status: Light Tobacco Smoker    Packs/day: 0.25    Years: 34.00    Pack years: 8.50    Types: Cigarettes  . Smokeless tobacco: Never Used  Substance and Sexual Activity  . Alcohol use: Never    Alcohol/week: 0.0 standard drinks  . Drug use: Never  . Sexual activity: Not on file  Other Topics Concern  . Not on file  Social History Narrative   Lives with her son.     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   Social Determinants of Engineer, drilling  Resource Strain: High Risk  . Difficulty of Paying Living Expenses: Hard  Food Insecurity: No Food Insecurity  . Worried About Charity fundraiser in the Last Year: Never true  . Ran Out of Food in the Last Year: Never true  Transportation Needs: No Transportation Needs  . Lack of Transportation (Medical): No  . Lack of Transportation (Non-Medical): No  Physical Activity:   . Days of Exercise per Week:   . Minutes of Exercise per Session:   Stress: No Stress Concern Present  . Feeling of Stress : Only a little  Social Connections:   . Frequency of Communication with Friends and Family:   . Frequency of Social Gatherings with Friends and Family:   . Attends Religious Services:   . Active Member of Clubs or Organizations:   . Attends Archivist Meetings:   Marland Kitchen Marital Status:   Intimate Partner Violence: Not At Risk  . Fear of Current or Ex-Partner: No  . Emotionally Abused: No  . Physically Abused: No  . Sexually Abused: No     Review of Systems    Objective:   BP  132/84 (BP Location: Left Arm, Patient Position: Sitting, Cuff Size: Large)   Pulse 72   Resp 14   Ht 4\' 9"  (1.448 m)   Wt 221 lb (100.2 kg)   BMI 47.82 kg/m   Physical Exam  NAD HEENT:  PERRL, EOMI, TMs pearly gray Neck:  Supple, No adenopathy, no thyromomegaly. Chest:  CTA CV:  RRR with normal S1 and S2, No S3, S4 or murmur.  No carotid bruits.  Carotid, radial and DP pulses normal and equal. Abd:  S, NT, No HSM or mass, + BS Back:  NT over L/S spinous processes.  Mild tenderness of bilateral paraspinous musculature, same L/S level.   Neuro, LE:  Motor 5/5, DTRs 2+/4 throughout.  Gait normal.  Assessment & Plan  1.  Hypertension:  Fair control.  CPM  2.  Hypertriglyceridemia:  Return for fasting labs in 1 week:  FLP, CmP.  3.  Chronic Low back pain:  Encouraged regular water activities.  Release for [facility] and [facility] on [place]--MR of back.

## 2019-10-23 NOTE — Patient Instructions (Addendum)
[facility] [place], [place] [address]  [phone] Hours of Operation Mondays to Thursdays: 8 am to 8 pm Fridays: 9 am to 8 pm Saturdays: 9 am to 1 pm Sundays: Closed  Learn how to swim, walk laps in 3 feet range or get on a bicycle! Learn how to read music!

## 2019-10-24 NOTE — Progress Notes (Signed)
Patient ID: Mackenzie Garrett, female   DOB: Dec 25, 1960, 59 y.o.   MRN: [id number]  Social worker completed SDOH, PHQ-9, and GAD-7 with patient. Patient stated she had concerns about paying her rent, but all the places she has gone to for rental assistance have only been catering to individuals affected by Covid, and her situation is not related to Covid. Patient scored a 3 on the PHQ-9 and a 1 on the GAD-7. Social worker informed patient of counseling services offered if she decides she wants them in the future. Social worker will look into rental assistance programs and get back to patient on findings.

## 2019-11-07 ENCOUNTER — Other Ambulatory Visit (INDEPENDENT_AMBULATORY_CARE_PROVIDER_SITE_OTHER): Payer: Self-pay

## 2019-11-07 ENCOUNTER — Other Ambulatory Visit: Payer: Self-pay

## 2019-11-07 DIAGNOSIS — E781 Pure hyperglyceridemia: Secondary | ICD-10-CM

## 2019-11-07 DIAGNOSIS — I10 Essential (primary) hypertension: Secondary | ICD-10-CM

## 2019-11-08 LAB — COMPREHENSIVE METABOLIC PANEL
ALT: 16 IU/L (ref 0–32)
AST: 24 IU/L (ref 0–40)
Albumin/Globulin Ratio: 1.2 (ref 1.2–2.2)
Albumin: 3.9 g/dL (ref 3.8–4.9)
Alkaline Phosphatase: 87 IU/L (ref 39–117)
BUN/Creatinine Ratio: 15 (ref 9–23)
BUN: 12 mg/dL (ref 6–24)
Bilirubin Total: 0.4 mg/dL (ref 0.0–1.2)
CO2: 22 mmol/L (ref 20–29)
Calcium: 9.1 mg/dL (ref 8.7–10.2)
Chloride: 109 mmol/L — ABNORMAL HIGH (ref 96–106)
Creatinine, Ser: 0.81 mg/dL (ref 0.57–1.00)
GFR calc Af Amer: 93 mL/min/{1.73_m2} (ref >59)
GFR calc non Af Amer: 80 mL/min/{1.73_m2} (ref >59)
Globulin, Total: 3.3 g/dL (ref 1.5–4.5)
Glucose: 77 mg/dL (ref 65–99)
Potassium: 4.2 mmol/L (ref 3.5–5.2)
Sodium: 140 mmol/L (ref 134–144)
Total Protein: 7.2 g/dL (ref 6.0–8.5)

## 2019-11-08 LAB — LIPID PANEL W/O CHOL/HDL RATIO
Cholesterol, Total: 103 mg/dL (ref 100–199)
HDL: 48 mg/dL (ref >39)
LDL Chol Calc (NIH): 37 mg/dL (ref 0–99)
Triglycerides: 97 mg/dL (ref 0–149)
VLDL Cholesterol Cal: 18 mg/dL (ref 5–40)

## 2020-01-12 ENCOUNTER — Other Ambulatory Visit: Payer: Self-pay

## 2020-01-12 ENCOUNTER — Emergency Department (HOSPITAL_BASED_OUTPATIENT_CLINIC_OR_DEPARTMENT_OTHER)
Admission: EM | Admit: 2020-01-12 | Discharge: 2020-01-12 | Disposition: A | Discharge status: Home / Self Care | Payer: Self-pay | Attending: Emergency Medicine | Admitting: Emergency Medicine

## 2020-01-12 ENCOUNTER — Encounter (HOSPITAL_BASED_OUTPATIENT_CLINIC_OR_DEPARTMENT_OTHER): Payer: Self-pay | Admitting: Emergency Medicine

## 2020-01-12 DIAGNOSIS — I10 Essential (primary) hypertension: Secondary | ICD-10-CM | POA: Insufficient documentation

## 2020-01-12 DIAGNOSIS — Y939 Activity, unspecified: Secondary | ICD-10-CM | POA: Insufficient documentation

## 2020-01-12 DIAGNOSIS — Y9259 Other trade areas as the place of occurrence of the external cause: Secondary | ICD-10-CM | POA: Insufficient documentation

## 2020-01-12 DIAGNOSIS — F17210 Nicotine dependence, cigarettes, uncomplicated: Secondary | ICD-10-CM | POA: Insufficient documentation

## 2020-01-12 DIAGNOSIS — S39012A Strain of muscle, fascia and tendon of lower back, initial encounter: Secondary | ICD-10-CM

## 2020-01-12 DIAGNOSIS — Y990 Civilian activity done for income or pay: Secondary | ICD-10-CM | POA: Insufficient documentation

## 2020-01-12 DIAGNOSIS — M67432 Ganglion, left wrist: Secondary | ICD-10-CM

## 2020-01-12 DIAGNOSIS — Z79899 Other long term (current) drug therapy: Secondary | ICD-10-CM | POA: Insufficient documentation

## 2020-01-12 DIAGNOSIS — X500XXA Overexertion from strenuous movement or load, initial encounter: Secondary | ICD-10-CM | POA: Insufficient documentation

## 2020-01-12 MED ORDER — CYCLOBENZAPRINE HCL 10 MG PO TABS
10.0000 mg | ORAL_TABLET | Freq: Two times a day (BID) | ORAL | 0 refills | Status: DC | PRN
Start: 2020-01-12 — End: 2021-04-16

## 2020-01-12 NOTE — ED Provider Notes (Signed)
Mackenzie Garrett Provider Note   CSN: [id number] Arrival date & time: 01/12/20  [id number]     History Chief Complaint  Patient presents with  . Back Pain  . Wrist Pain    Mackenzie Garrett is a 59 y.o. female.  Patient with history of high blood pressure, disc herniation, recurrent back pain from lifting on the job presents with worsening right lower back pain worse with movement and position.  No focal neurologic deficits, no fevers or chills.  No new injuries.  Patient also has pain in the left wrist.  Patient denies infectious symptoms.        Past Medical History:  Diagnosis Date  . Hypertension   . Hypertriglyceridemia   . Low back pain 2016   2 injuries:  left on first and right on second.  States MR showed bulging disc.  Second injury 10/2018    Patient Active Problem List   Diagnosis Date Noted  . Essential hypertension 10/23/2019  . Hypertriglyceridemia 10/23/2019  . Low back pain     Past Surgical History:  Procedure Laterality Date  . [place]  . CHOLECYSTECTOMY  1983  . UMBILICAL HERNIA REPAIR     When a baby     OB History   No obstetric history on file.     Family History  Problem Relation Age of Onset  . Diabetes Mother   . Kidney failure Mother        Due to DM  . Hypertension Mother   . Hyperlipidemia Mother   . Stroke Mother   . Seizures Brother   . Multiple sclerosis Sister   . Cancer Sister        Liver  . Seizures Brother   . COPD Brother   . Diabetes Brother   . Heart disease Brother   . Migraines Daughter   . Asthma Daughter   . Congenital heart disease Daughter        ?ASD or VSD--closed on its own.  . Polycystic ovary syndrome Daughter     Social History   Tobacco Use  . Smoking status: Light Tobacco Smoker    Packs/day: 0.25    Years: 34.00    Pack years: 8.50    Types: Cigarettes  . Smokeless tobacco: Never Used  Substance Use Topics  . Alcohol use: Never   Alcohol/week: 0.0 standard drinks  . Drug use: Never    Home Medications Prior to Admission medications   Medication Sig Start Date End Date Taking? Authorizing Provider  amLODipine (NORVASC) 10 MG tablet Take 1 tablet (10 mg total) by mouth daily. 10/23/19   Mack Hook, MD  benazepril (LOTENSIN) 10 MG tablet Take 1 tablet (10 mg total) by mouth daily. 10/23/19   Mack Hook, MD  cyclobenzaprine (FLEXERIL) 10 MG tablet Take 1 tablet (10 mg total) by mouth 2 (two) times daily as needed for muscle spasms. 01/12/20   Elnora Morrison, MD  fenofibrate (TRICOR) 145 MG tablet Take 1 tablet (145 mg total) by mouth daily. 10/23/19   Mack Hook, MD  Lido-Capsaicin-Men-Methyl Sal (LIDOPRO) 4-.324-06-10.5 % OINT LidoPro 4 %-27.5 %-0.0325 % topical ointment  apply to affected area TID prn pain    [provider]  meloxicam (MOBIC) 15 MG tablet Take 1 tablet (15 mg total) by mouth daily. Patient not taking: Reported on 10/23/2019 10/27/15   Wendie Agreste, MD    Allergies    Erythromycin, Naproxen, Penicillins, Percocet [oxycodone-acetaminophen], and Atorvastatin  Review of Systems   Review of Systems  Constitutional: Negative for chills and fever.  HENT: Negative for congestion.   Eyes: Negative for visual disturbance.  Respiratory: Negative for shortness of breath.   Cardiovascular: Negative for chest pain.  Gastrointestinal: Negative for abdominal pain and vomiting.  Genitourinary: Negative for dysuria and flank pain.  Musculoskeletal: Positive for back pain. Negative for neck pain and neck stiffness.  Skin: Negative for rash.  Neurological: Negative for weakness, light-headedness and headaches.    Physical Exam Updated Vital Signs BP (!) 142/79 (BP Location: Right Arm)   Pulse 81   Temp 98 F (36.7 C)   Resp 18   SpO2 100%   Physical Exam Vitals and nursing note reviewed.  Constitutional:      Appearance: She is well-developed.  HENT:     Head:  Normocephalic and atraumatic.  Eyes:     General:        Right eye: No discharge.        Left eye: No discharge.     Conjunctiva/sclera: Conjunctivae normal.  Neck:     Trachea: No tracheal deviation.  Cardiovascular:     Rate and Rhythm: Normal rate.  Pulmonary:     Effort: Pulmonary effort is normal.  Abdominal:     General: There is no distension.     Palpations: Abdomen is soft.     Tenderness: There is no abdominal tenderness. There is no guarding.  Musculoskeletal:        General: Tenderness present. No swelling or signs of injury.     Cervical back: Normal range of motion.     Comments: Patient has paraspinal lumbar tenderness no midline tenderness, no external sign of infection or rash.  Patient has mild tenderness and cyst palpated ganglion left wrist palmar aspect without sign of infection.  Mild pain with flexion extension.  No effusion to joint.  Neurovascularly intact.  Patient has 5+ strength flexion extension of hips knees and ankles bilateral lower extremity, sensation intact to major nerves to palpation.  Skin:    General: Skin is warm.     Findings: No rash.  Neurological:     Mental Status: She is alert and oriented to person, place, and time.     ED Results / Procedures / Treatments   Labs (all labs ordered are listed, but only abnormal results are displayed) Labs Reviewed - No data to display  EKG None  Radiology No results found.  Procedures Procedures (including critical care time)  Medications Ordered in ED Medications - No data to display  ED Course  I have reviewed the triage vital signs and the nursing notes.  Pertinent labs & imaging results that were available during my care of the patient were reviewed by me and considered in my medical decision making (see chart for details).    MDM Rules/Calculators/A&P                      Patient presents with 2 separate concerns.  Patient has acute on chronic lower back pain without new injury,  no neurologic deficit, no fevers.  Discussed supportive care and follow-up for physical therapy and further evaluation.  No indication for emergent imaging at this time.  Patient is clinically ganglion cyst left wrist discussed supportive care and also follow-up with the same sports med/Ortho docs.  Patient unfortunately says she does not have insurance and I asked her to call [facility]/the office to discuss options to help  her. Final Clinical Impression(s) / ED Diagnoses Final diagnoses:  Acute myofascial strain of lumbar region, initial encounter  Ganglion cyst of wrist, left    Rx / DC Orders ED Discharge Orders         Ordered    cyclobenzaprine (FLEXERIL) 10 MG tablet  2 times daily PRN     01/12/20 [id number]           Elnora Morrison, MD 01/12/20 [phone]

## 2020-01-12 NOTE — Discharge Instructions (Addendum)
Use Tylenol and ibuprofen as needed for pain. Use Flexeril as needed for muscle spasm. You can use ice regularly every 3-4 hours while awake for 10 to 15 minutes at a time. Follow-up with sports medicine doctor and primary doctor as discussed for further work-up, physical therapy and treatment options. Return to the emergency room for significant weakness, difficulty with bowel or bladder, fevers or new concerns.

## 2020-01-12 NOTE — ED Triage Notes (Signed)
Pt here with recurring right sided back pain from an old injury and new left wrist pain. Mechanism of injury to wrist is unknown.

## 2020-01-24 ENCOUNTER — Ambulatory Visit: Payer: Self-pay | Admitting: Internal Medicine

## 2020-02-24 ENCOUNTER — Other Ambulatory Visit: Payer: Self-pay

## 2020-02-24 DIAGNOSIS — E781 Pure hyperglyceridemia: Secondary | ICD-10-CM

## 2020-02-25 LAB — LIPID PANEL W/O CHOL/HDL RATIO
Cholesterol, Total: 137 mg/dL (ref 100–199)
HDL: 38 mg/dL — ABNORMAL LOW (ref >39)
LDL Chol Calc (NIH): 69 mg/dL (ref 0–99)
Triglycerides: 177 mg/dL — ABNORMAL HIGH (ref 0–149)
VLDL Cholesterol Cal: 30 mg/dL (ref 5–40)

## 2020-02-27 ENCOUNTER — Other Ambulatory Visit: Payer: Self-pay

## 2020-03-18 ENCOUNTER — Telehealth (INDEPENDENT_AMBULATORY_CARE_PROVIDER_SITE_OTHER): Payer: Self-pay | Admitting: Internal Medicine

## 2020-03-18 ENCOUNTER — Other Ambulatory Visit: Payer: Self-pay

## 2020-03-18 DIAGNOSIS — Z6841 Body Mass Index (BMI) 40.0 and over, adult: Secondary | ICD-10-CM

## 2020-03-18 DIAGNOSIS — I10 Essential (primary) hypertension: Secondary | ICD-10-CM

## 2020-03-18 DIAGNOSIS — E781 Pure hyperglyceridemia: Secondary | ICD-10-CM

## 2020-03-18 DIAGNOSIS — G8929 Other chronic pain: Secondary | ICD-10-CM

## 2020-03-18 DIAGNOSIS — M67432 Ganglion, left wrist: Secondary | ICD-10-CM

## 2020-03-18 DIAGNOSIS — M5441 Lumbago with sciatica, right side: Secondary | ICD-10-CM

## 2020-03-18 NOTE — Progress Notes (Signed)
    Subjective:    Patient ID: Mackenzie Garrett, female   DOB: 29-Oct-1960, 59 y.o.   MRN: [id number]   HPI   Virtual Visit with [facility]  1.  Hypertension:  Has not had bp checked since last here.  She would be okay with dropping in at no charge for BP check with [facility].   Tolerating bp meds fine.  2.  Hypertriglyceridemia:  Off Fenofibrate.  While her cholesterol is not as good as while taking it, she isn't really at a level to strongly recommend medication. She is not working on physical activity with water or cycling as discussed last visit.  3.  Right low back pain with radiculopathy:  Rediscussed MR of back--this was only done with her old left low back pain in 2017 and have not been able to get a copy of it--should be in [facility] [place] where she states it was done.  She has not had MR of spine since her right back injury.  Was following with [facility] and going through PT, but did not help.   She gets pain in right low back with radiation down right buttock to lateral thigh--more of numbness and tingling.  No weakness.  4.  Left wrist knot:  Volar aspect of left wrist--radial side.  Has noted a compressible knot there.  Hurts at times.  Using her cock up splint helps.    Current Meds  Medication Sig  . amLODipine (NORVASC) 10 MG tablet Take 1 tablet (10 mg total) by mouth daily.  . benazepril (LOTENSIN) 10 MG tablet Take 1 tablet (10 mg total) by mouth daily.  . Lido-Capsaicin-Men-Methyl Sal (LIDOPRO) 4-.324-06-10.5 % OINT LidoPro 4 %-27.5 %-0.0325 % topical ointment  apply to affected area TID prn pain   Allergies  Allergen Reactions  . Erythromycin   . Naproxen Nausea Only  . Penicillins   . Percocet [Oxycodone-Acetaminophen]   . Prednisone Other (See Comments)    "spaced out"  Was on other meds, including narcotics at same time.  . Atorvastatin Anxiety    States she was jittery and lips twitched with this.       Review of Systems    Objective:   There  were no vitals taken for this visit.  Physical Exam  NAD   Assessment & Plan   1.  Hypertension:  Will have her come in for bp and pulse check in next week.  2.  Right low back pain with radiculopathy:  Symptoms support likely diagnosis of spinal stenosis as well.  She will check her papers from [facility] to see if she has 6 months of financial assistance and get back to [place].  If she does not, will need to apply to orange card and can obtain that way at low cost.  3.  Obesity:  Discussed small weekly goals for physical activity and improving dietary habits.  4.  Hypertriglyceridemia/Dyslipidemia:  Hold on restarting fenofibrate.  Lifestyle changes as in #3 and recheck FLP in 3 months.  5.  Ganglion cyst, left wrist:  To let me know when needs something done due to function abnormality or pain.

## 2020-04-02 ENCOUNTER — Other Ambulatory Visit: Payer: Self-pay

## 2020-04-02 ENCOUNTER — Ambulatory Visit (INDEPENDENT_AMBULATORY_CARE_PROVIDER_SITE_OTHER): Payer: Self-pay

## 2020-04-02 VITALS — BP 112/68 | HR 78

## 2020-04-02 DIAGNOSIS — I10 Essential (primary) hypertension: Secondary | ICD-10-CM

## 2020-04-02 NOTE — Progress Notes (Signed)
Patient came in for BP and pulse check. Patient BP is in normal range. Informed to continue current dose of medication. Patient verbalized understanding.

## 2020-06-16 ENCOUNTER — Other Ambulatory Visit: Payer: Self-pay

## 2020-06-16 DIAGNOSIS — E781 Pure hyperglyceridemia: Secondary | ICD-10-CM

## 2020-06-17 LAB — LIPID PANEL W/O CHOL/HDL RATIO
Cholesterol, Total: 139 mg/dL (ref 100–199)
HDL: 41 mg/dL (ref >39)
LDL Chol Calc (NIH): 68 mg/dL (ref 0–99)
Triglycerides: 180 mg/dL — ABNORMAL HIGH (ref 0–149)
VLDL Cholesterol Cal: 30 mg/dL (ref 5–40)

## 2020-06-19 ENCOUNTER — Encounter: Payer: Self-pay | Admitting: Internal Medicine

## 2020-08-27 ENCOUNTER — Other Ambulatory Visit: Payer: Self-pay

## 2020-08-27 ENCOUNTER — Other Ambulatory Visit (INDEPENDENT_AMBULATORY_CARE_PROVIDER_SITE_OTHER): Payer: Self-pay | Admitting: Internal Medicine

## 2020-08-27 DIAGNOSIS — R0989 Other specified symptoms and signs involving the circulatory and respiratory systems: Secondary | ICD-10-CM

## 2020-08-27 LAB — POC COVID19 BINAXNOW: SARS Coronavirus 2 Ag: NEGATIVE

## 2020-08-27 NOTE — Progress Notes (Signed)
Patient with runny nose, sore throat.  No fever. No definite exposure to COVID. Would like to be tested.  Tested negative.

## 2020-11-11 ENCOUNTER — Telehealth: Payer: Self-pay | Admitting: Internal Medicine

## 2020-11-11 MED ORDER — BENAZEPRIL HCL 10 MG PO TABS: 10.0000 mg | ORAL_TABLET | Freq: Every day | ORAL | 0 refills | Status: DC

## 2020-11-11 MED ORDER — AMLODIPINE BESYLATE 10 MG PO TABS: 10.0000 mg | ORAL_TABLET | Freq: Every day | ORAL | 0 refills | Status: DC

## 2020-11-11 NOTE — Telephone Encounter (Signed)
Patient called requesting a refill for amLODipine (NORVASC) 10 MG tablet and  benazepril (LOTENSIN) 10 MG tablet

## 2020-11-17 NOTE — Telephone Encounter (Signed)
Spoke with patient and notified of Doctor message. Appointment for patient was scheduled for 04/16/2021

## 2020-11-17 NOTE — Telephone Encounter (Signed)
Patient was also placed on the waiting list.

## 2020-11-17 NOTE — Telephone Encounter (Signed)
Called patient and left a message asking to call back to schedule an appointment.

## 2020-12-07 ENCOUNTER — Encounter: Payer: Self-pay | Admitting: Internal Medicine

## 2020-12-07 ENCOUNTER — Other Ambulatory Visit: Payer: Self-pay

## 2020-12-07 ENCOUNTER — Other Ambulatory Visit (INDEPENDENT_AMBULATORY_CARE_PROVIDER_SITE_OTHER): Payer: Self-pay | Admitting: Internal Medicine

## 2020-12-07 DIAGNOSIS — Z6841 Body Mass Index (BMI) 40.0 and over, adult: Secondary | ICD-10-CM

## 2020-12-07 DIAGNOSIS — R059 Cough, unspecified: Secondary | ICD-10-CM

## 2020-12-07 DIAGNOSIS — U071 COVID-19: Secondary | ICD-10-CM

## 2020-12-07 DIAGNOSIS — I10 Essential (primary) hypertension: Secondary | ICD-10-CM

## 2020-12-07 LAB — POC COVID19 BINAXNOW: SARS Coronavirus 2 Ag: POSITIVE — AB

## 2020-12-07 MED ORDER — PAXLOVID 20 X 150 MG & 10 X 100MG PO TBPK: ORAL_TABLET | ORAL | 0 refills | Status: AC

## 2020-12-07 NOTE — Progress Notes (Signed)
    Subjective:    Patient ID: Mackenzie Garrett, female   DOB: Mar 10, 1961, 60 y.o.   MRN: [id number]   HPI  Evaluated while outside in car.  Patient here with history of cough, congestion, feverish, fatigue and body aches early on in illness starting April 19th.  Has a cousin who recently tested + for COVID with whom she was with the day prior to her symptoms starting.  Cousin's symptoms started later in week last week.   Her son is also with a cough.   No loss of smell. No lower respiratory symptoms Feels a bit better than she did last week when symptoms started.    Allergies  Allergen Reactions  . Erythromycin   . Naproxen Nausea Only  . Penicillins   . Percocet [Oxycodone-Acetaminophen]   . Prednisone Other (See Comments)    "spaced out"  Was on other meds, including narcotics at same time.  . Atorvastatin Anxiety    States she was jittery and lips twitched with this.       Review of Systems    Objective:   There were no vitals taken for this visit.  Physical Exam Nasally congested NAD  COVID POC + Assessment & Plan   COVID:  Does have risk factors of morbid obesity and hypertension.  She is beyond 5 days of illness to get a better result with Paxlovid.   Cost for Paxlovid is $20 and patient would like to utilize despite being beyond the 5 days. Referral to [facility] Covid treatment/outpatient Discussed need to communicate with her contacts from 2 days before symptoms until today. Her son, who lives with her, should also be tested and stay home until receives results.  Not clear if he is vaccinated or when his symptoms started. Encouraged her to stay home if not necessary for her to go out.  If she does, recommend masking with KN95 or N95 mask.  She is 7 days out from first symptoms and mildly improving.

## 2020-12-08 ENCOUNTER — Telehealth (HOSPITAL_COMMUNITY): Payer: Self-pay

## 2020-12-08 NOTE — Telephone Encounter (Signed)
Called to discuss with patient about COVID-19 symptoms and the use of one of the available treatments for those with mild to moderate Covid symptoms and at a high risk of hospitalization.  Pt appears to qualify for outpatient treatment due to co-morbid conditions and/or a member of an at-risk group in accordance with the FDA Emergency Use Authorization.   Pt stated her MD called her in paxlovid which she has started today.   Janine Ores, RN

## 2021-02-08 ENCOUNTER — Telehealth: Payer: Self-pay

## 2021-02-08 NOTE — Telephone Encounter (Signed)
Pt wants refills for Amlodipine and Benazepril Rx. She does not have any refills left and does not have an appt until September

## 2021-02-10 MED ORDER — BENAZEPRIL HCL 10 MG PO TABS: 10.0000 mg | ORAL_TABLET | Freq: Every day | ORAL | 0 refills | Status: DC

## 2021-02-10 MED ORDER — AMLODIPINE BESYLATE 10 MG PO TABS: 10.0000 mg | ORAL_TABLET | Freq: Every day | ORAL | 0 refills | Status: DC

## 2021-02-11 NOTE — Telephone Encounter (Signed)
Called patient and left a voicemail 

## 2021-02-12 NOTE — Telephone Encounter (Signed)
Patient notified of Rx.  

## 2021-03-23 ENCOUNTER — Ambulatory Visit: Payer: Medicaid Other | Admitting: Obstetrics and Gynecology

## 2021-04-16 ENCOUNTER — Ambulatory Visit (INDEPENDENT_AMBULATORY_CARE_PROVIDER_SITE_OTHER): Payer: Self-pay | Admitting: Internal Medicine

## 2021-04-16 ENCOUNTER — Encounter: Payer: Self-pay | Admitting: Internal Medicine

## 2021-04-16 ENCOUNTER — Other Ambulatory Visit: Payer: Self-pay

## 2021-04-16 VITALS — BP 110/76 | HR 76 | Resp 20 | Ht <= 58 in | Wt 227.0 lb

## 2021-04-16 DIAGNOSIS — M25561 Pain in right knee: Secondary | ICD-10-CM

## 2021-04-16 DIAGNOSIS — I10 Essential (primary) hypertension: Secondary | ICD-10-CM

## 2021-04-16 DIAGNOSIS — Z114 Encounter for screening for human immunodeficiency virus [HIV]: Secondary | ICD-10-CM

## 2021-04-16 DIAGNOSIS — E781 Pure hyperglyceridemia: Secondary | ICD-10-CM

## 2021-04-16 DIAGNOSIS — Z1159 Encounter for screening for other viral diseases: Secondary | ICD-10-CM

## 2021-04-16 DIAGNOSIS — G8929 Other chronic pain: Secondary | ICD-10-CM

## 2021-04-16 DIAGNOSIS — Z6841 Body Mass Index (BMI) 40.0 and over, adult: Secondary | ICD-10-CM

## 2021-04-16 DIAGNOSIS — M545 Low back pain, unspecified: Secondary | ICD-10-CM

## 2021-04-16 MED ORDER — BENAZEPRIL HCL 10 MG PO TABS: 10.0000 mg | ORAL_TABLET | Freq: Every day | ORAL | 3 refills | Status: AC

## 2021-04-16 MED ORDER — AMLODIPINE BESYLATE 10 MG PO TABS: 10.0000 mg | ORAL_TABLET | Freq: Every day | ORAL | 3 refills | Status: AC

## 2021-04-16 MED ORDER — GABAPENTIN 100 MG PO CAPS
ORAL_CAPSULE | ORAL | 11 refills | Status: DC
Start: 2021-04-16 — End: 2022-05-23

## 2021-04-16 NOTE — Patient Instructions (Signed)
Gabapentin: Start taking Gabapentin 100 mg cap 1 cap at bedtime. In 3 days, increase to 2 caps at bedtime. In another 3 days, increase to 3 caps at bedtime You should be taking 3 caps at bedtime at this point.  In 3 days, start another 1 cap in the morning In 3 more days, increase to 2 caps in the morning In 3 more days, increase to 3 caps in the morning:  You should be taking 3 caps in the morning and 3 caps at bedtime at this point.  In 3 days, start another dose midday--1 cap In 3 more days, increase to 2 caps midday In 3 more days, increase to 3 caps midday. You should be taking 3 caps 3 times daily at this point.  Stay on this dose until follow up If you do not tolerate increasing the dose at any point, hold on the dose you tolerate or call clinic if having problems 

## 2021-04-16 NOTE — Progress Notes (Signed)
Subjective:    Patient ID: Mackenzie Garrett, female   DOB: 09-14-1960, 60 y.o.   MRN: [id number]   HPI   Hypertension:  Taking her meds regularly, though did not take either this morning as she is fasting for labs.  2.   Dyslipidemia:  Low HDL and mildly high Triglycerides.  Has exercises she does on a bed.  Walks in apartment.  Has to be careful with her right knee.  Has cut out sodas to 1-2 times weekly--16 oz each time.  3.  Chronic low back pain:  continues to be a problem.  Tries to be mobile, but can only do so much and then has to stop.  Right thoracic pain from when a patient put her full weight on her .  She also states had right lateral and posterior thigh numbness when right thoracic back pain bad.  The lower back pain, which she states she was told was due to a bulging disc (apparently had MR of spine in 2017 at [place]) feels better when she leans forward.   Back with Dr. Nelva Bush in 2020, refused injection.  4.  Chronic right knee pain:  was in [place]--went to [place] to an orthopedic doc.  Had fluid removed back in 2008.  She is not certain if she had an injection then as well.     Current Meds  Medication Sig   amLODipine (NORVASC) 10 MG tablet Take 1 tablet (10 mg total) by mouth daily.   benazepril (LOTENSIN) 10 MG tablet Take 1 tablet (10 mg total) by mouth daily.   Allergies  Allergen Reactions   Erythromycin    Naproxen Nausea Only   Penicillins    Percocet [Oxycodone-Acetaminophen]    Prednisone Other (See Comments)    "spaced out"  Was on other meds, including narcotics at same time.   Atorvastatin Anxiety    States she was jittery and lips twitched with this.       Review of Systems    Objective:   BP 110/76 (BP Location: Right Arm, Patient Position: Sitting, Cuff Size: Large)   Pulse 76   Resp 20   Ht '4\' 9"'$  (1.448 m)   Wt 227 lb (103 kg)   BMI 49.12 kg/m   Physical Exam NAD HEENT:  PERRL, EOMI Neck:  Supple, No adenopathy, no  thyromegaly Chest:  CTA CV:  RRR with normal S1 and S2, No S3, S4 or murmur.  No carotid bruits.  Carotid, radial and DP pulses normal and equal.   MS:  Right knee:  decreased ROM--just over 90 degrees with flexing.  Full extension.  Most tenderness is with compression over patella, perhaps increased with compression of patella against anterior joint .  Mild tenderness along lateral joint margin, but not right.  NT in popliteal fossa.  No effusion.  No definite pain or laxity with stress maneuvers of cruciates and collaterals.  Feet with mild edema Low back with tenderness over spinous processes of L/S spine and right paraspinous musculature.  Mild tenderness over right lower thoracic paraspinous musculature.   Neuro:  Motor 5/5 and DTRs 2+/4 throughout. Assessment & Plan   Hypertension:  controlled.  Refilled meds.  CBC, CMP.  2.  Obesity:  long discussion regarding lifestyle changes:  encouraged weaning or stopping sweet tea and remaining sodas first and then moving to other goals.  Eats a lot of carbs.  Encouraged consistent water exercise or recumbent bicycle to prevent increased pain of knee and low  back.  A1C, TSH  3.  Right knee pain:  ?prepatellar bursitis vs patellofemoral syndrome/OA:  would like to get Xray vs MR, but she does not have the funds to do this currently.  Needs orange card to obtain less expensively.    4.  Low back pain/likely lumbar stenosis:  orange card to obtain MR and start gabapentin--titrate to pain with max of 300 mg 3 times daily.  ROI for [facility] for MR of back 2017 (not able to see in [place])  5.  Dyslipidemia/triglyceridemia:  FLP.  Continues off fenofibrate  6.  HM:  Hep C and HIV screening.  Schedule CPE with pap.

## 2021-04-17 LAB — CBC WITH DIFFERENTIAL/PLATELET
Basophils Absolute: 0.1 10*3/uL (ref 0.0–0.2)
Basos: 1 %
EOS (ABSOLUTE): 0.1 10*3/uL (ref 0.0–0.4)
Eos: 1 %
Hematocrit: 42.4 % (ref 34.0–46.6)
Hemoglobin: 14.5 g/dL (ref 11.1–15.9)
Immature Grans (Abs): 0 10*3/uL (ref 0.0–0.1)
Immature Granulocytes: 0 %
Lymphocytes Absolute: 3.1 10*3/uL (ref 0.7–3.1)
Lymphs: 39 %
MCH: 30 pg (ref 26.6–33.0)
MCHC: 34.2 g/dL (ref 31.5–35.7)
MCV: 88 fL (ref 79–97)
Monocytes Absolute: 0.4 10*3/uL (ref 0.1–0.9)
Monocytes: 6 %
Neutrophils Absolute: 4.3 10*3/uL (ref 1.4–7.0)
Neutrophils: 53 %
Platelets: 280 10*3/uL (ref 150–450)
RBC: 4.83 x10E6/uL (ref 3.77–5.28)
RDW: 12.7 % (ref 11.7–15.4)
WBC: 8 10*3/uL (ref 3.4–10.8)

## 2021-04-17 LAB — COMPREHENSIVE METABOLIC PANEL
ALT: 12 IU/L (ref 0–32)
AST: 15 IU/L (ref 0–40)
Albumin/Globulin Ratio: 0.9 — ABNORMAL LOW (ref 1.2–2.2)
Albumin: 3.6 g/dL — ABNORMAL LOW (ref 3.8–4.9)
Alkaline Phosphatase: 122 IU/L — ABNORMAL HIGH (ref 44–121)
BUN/Creatinine Ratio: 11 — ABNORMAL LOW (ref 12–28)
BUN: 9 mg/dL (ref 8–27)
Bilirubin Total: 0.4 mg/dL (ref 0.0–1.2)
CO2: 24 mmol/L (ref 20–29)
Calcium: 9.6 mg/dL (ref 8.7–10.3)
Chloride: 102 mmol/L (ref 96–106)
Creatinine, Ser: 0.83 mg/dL (ref 0.57–1.00)
Globulin, Total: 4.1 g/dL (ref 1.5–4.5)
Glucose: 74 mg/dL (ref 65–99)
Potassium: 4.2 mmol/L (ref 3.5–5.2)
Sodium: 138 mmol/L (ref 134–144)
Total Protein: 7.7 g/dL (ref 6.0–8.5)
eGFR: 81 mL/min/{1.73_m2} (ref >59)

## 2021-04-17 LAB — LIPID PANEL W/O CHOL/HDL RATIO
Cholesterol, Total: 151 mg/dL (ref 100–199)
HDL: 44 mg/dL (ref >39)
LDL Chol Calc (NIH): 79 mg/dL (ref 0–99)
Triglycerides: 166 mg/dL — ABNORMAL HIGH (ref 0–149)
VLDL Cholesterol Cal: 28 mg/dL (ref 5–40)

## 2021-04-17 LAB — TSH: TSH: 0.575 u[IU]/mL (ref 0.450–4.500)

## 2021-04-17 LAB — HGB A1C W/O EAG: Hgb A1c MFr Bld: 5.5 % (ref 4.8–5.6)

## 2021-04-17 LAB — HIV ANTIBODY (ROUTINE TESTING W REFLEX): HIV Screen 4th Generation wRfx: NONREACTIVE

## 2021-04-17 LAB — HEPATITIS C ANTIBODY: Hep C Virus Ab: 0.1 s/co ratio (ref 0.0–0.9)

## 2021-04-23 ENCOUNTER — Telehealth: Payer: Self-pay

## 2021-04-23 NOTE — Telephone Encounter (Signed)
Pt call to inform that she is no longer taking gabapentin medication that was given 04/16/21. Two days after start patient was feeling jittery, restless, anxious and had swelling in her feet. Wanted to let dr Amil Amen know

## 2021-05-10 NOTE — Telephone Encounter (Signed)
Okay 

## 2021-06-21 ENCOUNTER — Encounter: Payer: Medicaid Other | Admitting: Internal Medicine

## 2022-04-29 ENCOUNTER — Other Ambulatory Visit: Payer: Self-pay | Admitting: Student

## 2022-04-29 ENCOUNTER — Ambulatory Visit
Admission: RE | Admit: 2022-04-29 | Discharge: 2022-04-29 | Disposition: A | Discharge status: Home / Self Care | Payer: Medicare Other | Source: Ambulatory Visit | Attending: Student | Admitting: Student

## 2022-04-29 DIAGNOSIS — R109 Unspecified abdominal pain: Secondary | ICD-10-CM

## 2022-05-04 ENCOUNTER — Encounter: Payer: Self-pay | Admitting: Gastroenterology

## 2022-05-23 ENCOUNTER — Ambulatory Visit (AMBULATORY_SURGERY_CENTER): Payer: Self-pay

## 2022-05-23 VITALS — Ht 59.0 in | Wt 228.0 lb

## 2022-05-23 DIAGNOSIS — Z1211 Encounter for screening for malignant neoplasm of colon: Secondary | ICD-10-CM

## 2022-05-23 MED ORDER — NA SULFATE-K SULFATE-MG SULF 17.5-3.13-1.6 GM/177ML PO SOLN: 1.0000 | Freq: Once | ORAL | 0 refills | Status: AC

## 2022-05-23 NOTE — Progress Notes (Signed)
No egg or soy allergy known to patient  No issues known to pt with past sedation with any surgeries or procedures Patient denies ever being told they had issues or difficulty with intubation  No FH of Malignant Hyperthermia Pt is not on diet pills Pt is not on home 02  Pt is not on blood thinners  Pt denies issues with constipation;  No A fib or A flutter Have any cardiac testing pending--NO Pt instructed to use Single[employer] or GoodRx for a price reduction on prep  Insurance verified during Pre Visit=[employer] Medicare

## 2022-05-27 ENCOUNTER — Telehealth: Payer: Self-pay | Admitting: Gastroenterology

## 2022-05-27 NOTE — Telephone Encounter (Signed)
Inbound call from patient stating that she went to the pharmacy to pick SUPREP up and it was too expensive. Patient is requesting a alternative prep be sent in to her pharmacy. Patient is scheduled for procedure on 10/23 at 11:30. Please advise.

## 2022-05-27 NOTE — Telephone Encounter (Signed)
For financial reason patient needs to do Miralx. Denies constipation. Mailing instructions

## 2022-05-31 ENCOUNTER — Encounter: Payer: Self-pay | Admitting: Gastroenterology

## 2022-06-09 ENCOUNTER — Ambulatory Visit (AMBULATORY_SURGERY_CENTER): Payer: Medicare Other | Admitting: Gastroenterology

## 2022-06-09 ENCOUNTER — Encounter: Payer: Self-pay | Admitting: Gastroenterology

## 2022-06-09 VITALS — BP 113/59 | HR 71 | Temp 97.5°F | Resp 24 | Ht 59.0 in | Wt 228.0 lb

## 2022-06-09 DIAGNOSIS — D123 Benign neoplasm of transverse colon: Secondary | ICD-10-CM

## 2022-06-09 DIAGNOSIS — D124 Benign neoplasm of descending colon: Secondary | ICD-10-CM

## 2022-06-09 DIAGNOSIS — Z1211 Encounter for screening for malignant neoplasm of colon: Secondary | ICD-10-CM | POA: Diagnosis not present

## 2022-06-09 DIAGNOSIS — D125 Benign neoplasm of sigmoid colon: Secondary | ICD-10-CM

## 2022-06-09 DIAGNOSIS — D122 Benign neoplasm of ascending colon: Secondary | ICD-10-CM

## 2022-06-09 MED ORDER — SODIUM CHLORIDE 0.9 % IV SOLN: 500.0000 mL | Freq: Once | INTRAVENOUS | Status: DC

## 2022-06-09 NOTE — Progress Notes (Signed)
[facility] History and Physical   Primary Care Physician:  Pcp, No   Reason for Procedure:   Colon cancer screening  Plan:    Screening colonoscopy     HPI: Mackenzie Garrett is a 61 y.o. female undergoing initial average risk screening colonoscopy.  She has no family history of colon cancer and no chronic GI symptoms.    Past Medical History:  Diagnosis Date   GERD (gastroesophageal reflux disease)    hx of (PRN meds)- no longer on meds   Hypertension    on meds   Hypertriglyceridemia    on meds   Low back pain 2016   2 injuries:  left on first and right on second.  States MR showed bulging disc.  Second injury 10/2018    Past Surgical History:  Procedure Laterality Date   CESAREAN SECTION  [place]   as a baby    Prior to Admission medications   Medication Sig Start Date End Date Taking? Authorizing Provider  amLODipine (NORVASC) 10 MG tablet Take 1 tablet (10 mg total) by mouth daily. 04/16/21  Yes Mack Hook, MD  benazepril (LOTENSIN) 10 MG tablet Take 1 tablet (10 mg total) by mouth daily. 04/16/21  Yes Mack Hook, MD  pantoprazole (PROTONIX) 40 MG tablet Take 40 mg by mouth daily as needed. Patient not taking: Reported on 05/23/2022 04/29/22   [provider]    Current Outpatient Medications  Medication Sig Dispense Refill   amLODipine (NORVASC) 10 MG tablet Take 1 tablet (10 mg total) by mouth daily. 90 tablet 3   benazepril (LOTENSIN) 10 MG tablet Take 1 tablet (10 mg total) by mouth daily. 90 tablet 3   pantoprazole (PROTONIX) 40 MG tablet Take 40 mg by mouth daily as needed. (Patient not taking: Reported on 05/23/2022)     Current Facility-Administered Medications  Medication Dose Route Frequency Provider Last Rate Last Admin   0.9 %  sodium chloride infusion  500 mL Intravenous Once  Daryel November, MD        Allergies as of 06/09/2022 - Review Complete 06/09/2022  Allergen Reaction Noted   Erythromycin  08/14/2015   Naproxen Hives and Nausea Only 10/23/2019   Penicillins  08/14/2015   Percocet [oxycodone-acetaminophen] Hives and Nausea And Vomiting 08/14/2015   Prednisone Other (See Comments) 03/18/2020   Atorvastatin Anxiety 10/23/2019   Gabapentin Hives, Itching, and Rash 05/23/2022   Oxycodone-acetaminophen Hives, Nausea And Vomiting, and Rash 05/23/2022    Family History  Problem Relation Age of Onset   Diabetes Mother    Kidney failure Mother        Due to DM   Hypertension Mother    Hyperlipidemia Mother    Stroke Mother    Multiple sclerosis Sister    Liver cancer Sister    Seizures Brother    Seizures Brother    COPD Brother    Diabetes Brother    Heart disease Brother    Migraines Daughter    Asthma Daughter    Congenital heart disease Daughter        ?ASD or VSD--closed on its own.   Polycystic ovary syndrome Daughter    Colon polyps Neg Hx    Colon cancer Neg Hx    Esophageal cancer Neg Hx    Rectal cancer  Neg Hx    Stomach cancer Neg Hx     Social History   Socioeconomic History   Marital status: Single    Spouse name: Not on file   Number of children: 4   Years of education: 12   Highest education level: Not on file  Occupational History   Occupation: CNA    Comment: No work due to chronic back issues.  Tobacco Use   Smoking status: Light Smoker    Packs/day: 0.25    Years: 34.00    Total pack years: 8.50    Types: Cigarettes   Smokeless tobacco: Never  Vaping Use   Vaping Use: Never used  Substance and Sexual Activity   Alcohol use: Never    Alcohol/week: 0.0 standard drinks of alcohol   Drug use: Never   Sexual activity: Not on file  Other Topics Concern   Not on file  Social History Narrative   Lives with her son.     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   Social  Determinants of Health   Financial Resource Strain: High Risk (10/23/2019)   Overall Financial Resource Strain (CARDIA)    Difficulty of Paying Living Expenses: Hard  Food Insecurity: No Food Insecurity (10/23/2019)   Hunger Vital Sign    Worried About Running Out of Food in the Last Year: Never true    Ran Out of Food in the Last Year: Never true  Transportation Needs: No Transportation Needs (10/23/2019)   PRAPARE - Hydrologist (Medical): No    Lack of Transportation (Non-Medical): No  Physical Activity: Not on file  Stress: No Stress Concern Present (10/23/2019)   [place]    Feeling of Stress : Only a little  Social Connections: Not on file  Intimate Partner Violence: Not At Risk (10/23/2019)   Humiliation, Afraid, Rape, and Kick questionnaire    Fear of Current or Ex-Partner: No    Emotionally Abused: No    Physically Abused: No    Sexually Abused: No    Review of Systems:  All other review of systems negative except as mentioned in the HPI.  Physical Exam: Vital signs BP (!) 122/57   Pulse 73   Temp (!) 97.5 F (36.4 C)   Ht '4\' 11"'$  (1.499 m)   Wt 228 lb (103.4 kg)   SpO2 95%   BMI 46.05 kg/m   General:   Alert,  Well-developed, well-nourished, pleasant and cooperative in NAD Airway:  Mallampati 2 Lungs:  Clear throughout to auscultation.   Heart:  Regular rate and rhythm; no murmurs, clicks, rubs,  or gallops. Abdomen:  Soft, nontender and nondistended. Normal bowel sounds.   Neuro/Psych:  Normal mood and affect. A and O x 3   Mackenzie Garrett E. Candis Schatz, MD [facility]

## 2022-06-09 NOTE — Progress Notes (Signed)
Called to room to assist during endoscopic procedure.  Patient ID and intended procedure confirmed with present staff. Received instructions for my participation in the procedure from the performing physician.  

## 2022-06-09 NOTE — Progress Notes (Signed)
Vital signs checked by:DT  The patient states no changes in medical or surgical history since pre-visit screening on 05/23/22

## 2022-06-09 NOTE — Patient Instructions (Signed)
Handout provided on polyps.   Continue present medications. Await pathology results.   Repeat colonoscopy (date not yet determined) for surveillance based on pathology results.   YOU HAD AN ENDOSCOPIC PROCEDURE TODAY AT [facility]:   Refer to the procedure report that was given to you for any specific questions about what was found during the examination.  If the procedure report does not answer your questions, please call your gastroenterologist to clarify.  If you requested that your care partner not be given the details of your procedure findings, then the procedure report has been included in a sealed envelope for you to review at your convenience later.  YOU SHOULD EXPECT: Some feelings of bloating in the abdomen. Passage of more gas than usual.  Walking can help get rid of the air that was put into your GI tract during the procedure and reduce the bloating. If you had a lower endoscopy (such as a colonoscopy or flexible sigmoidoscopy) you may notice spotting of blood in your stool or on the toilet paper. If you underwent a bowel prep for your procedure, you may not have a normal bowel movement for a few days.  Please Note:  You might notice some irritation and congestion in your nose or some drainage.  This is from the oxygen used during your procedure.  There is no need for concern and it should clear up in a day or so.  SYMPTOMS TO REPORT IMMEDIATELY:  Following lower endoscopy (colonoscopy or flexible sigmoidoscopy):  Excessive amounts of blood in the stool  Significant tenderness or worsening of abdominal pains  Swelling of the abdomen that is new, acute  Fever of 100F or higher  For urgent or emergent issues, a gastroenterologist can be reached at any hour by calling [phone]. Do not use MyChart messaging for urgent concerns.    DIET:  We do recommend a small meal at first, but then you may proceed to your regular diet.  Drink plenty of fluids but you  should avoid alcoholic beverages for 24 hours.  ACTIVITY:  You should plan to take it easy for the rest of today and you should NOT DRIVE or use heavy machinery until tomorrow (because of the sedation medicines used during the test).    FOLLOW UP: Our staff will call the number listed on your records the next business day following your procedure.  We will call around 7:15- 8:00 am to check on you and address any questions or concerns that you may have regarding the information given to you following your procedure. If we do not reach you, we will leave a message.     If any biopsies were taken you will be contacted by phone or by letter within the next 1-3 weeks.  Please call us at [phone] if you have not heard about the biopsies in 3 weeks.    SIGNATURES/CONFIDENTIALITY: You and/or your care partner have signed paperwork which will be entered into your electronic medical record.  These signatures attest to the fact that that the information above on your After Visit Summary has been reviewed and is understood.  Full responsibility of the confidentiality of this discharge information lies with you and/or your care-partner.

## 2022-06-09 NOTE — Progress Notes (Signed)
Sedate, gd SR, tolerated procedure well, VSS, report to RN 

## 2022-06-09 NOTE — Op Note (Signed)
[facility] Patient Name: Mackenzie Garrett Procedure Date: 06/09/2022 12:05 PM MRN: [id number] Endoscopist: Nicki Reaper E. Candis Schatz , MD, [id number] Age: 61 Referring MD:  Date of Birth: 01/05/1961 Gender: Female Account #: [id number] Procedure:                Colonoscopy Indications:              Screening for colorectal malignant neoplasm, This                            is the patient's first colonoscopy Medicines:                Monitored Anesthesia Care Procedure:                Pre-Anesthesia Assessment:                           - Prior to the procedure, a History and Physical                            was performed, and patient medications and                            allergies were reviewed. The patient's tolerance of                            previous anesthesia was also reviewed. The risks                            and benefits of the procedure and the sedation                            options and risks were discussed with the patient.                            All questions were answered, and informed consent                            was obtained. Prior Anticoagulants: The patient has                            taken no anticoagulant or antiplatelet agents. ASA                            Grade Assessment: II - A patient with mild systemic                            disease. After reviewing the risks and benefits,                            the patient was deemed in satisfactory condition to                            undergo the procedure.  After obtaining informed consent, the colonoscope                            was passed under direct vision. Throughout the                            procedure, the patient's blood pressure, pulse, and                            oxygen saturations were monitored continuously. The                            Colonoscope was introduced through the anus and                            advanced to the  the cecum, identified by                            appendiceal orifice and ileocecal valve. The                            colonoscopy was performed without difficulty. The                            patient tolerated the procedure well. The quality                            of the bowel preparation was good. The ileocecal                            valve, appendiceal orifice, and rectum were                            photographed. The bowel preparation used was SUPREP                            via split dose instruction. Scope In: 12:13:23 PM Scope Out: 12:40:37 PM Scope Withdrawal Time: 0 hours 22 minutes 51 seconds  Total Procedure Duration: 0 hours 27 minutes 14 seconds  Findings:                 The perianal and digital rectal examinations were                            normal. Pertinent negatives include normal                            sphincter tone and no palpable rectal lesions.                           Three sessile polyps were found in the ascending                            colon. The polyps were 2 to 3 mm in size. These  polyps were removed with a cold snare. Resection                            was complete, but the polyp tissue was only                            partially retrieved. The pathology specimen was                            placed into Bottle Number 1. Estimated blood loss                            was minimal.                           Two sessile polyps were found in the hepatic                            flexure. The polyps were 2 to 6 mm in size. These                            polyps were removed with a cold snare. Resection                            and retrieval were complete. The pathology specimen                            was placed into Bottle Number 1. Estimated blood                            loss was minimal.                           Two sessile polyps were found in the transverse                             colon. The polyps were 3 to 4 mm in size. These                            polyps were removed with a cold snare. Resection                            and retrieval were complete. The pathology specimen                            was placed into Bottle Number 1. Estimated blood                            loss was minimal.                           Two sessile polyps were found in the descending  colon. The polyps were 3 to 6 mm in size. These                            polyps were removed with a cold snare. Resection                            and retrieval were complete. The pathology specimen                            was placed into Bottle Number 2. Estimated blood                            loss was minimal.                           Two sessile polyps were found in the sigmoid colon.                            The polyps were 2 to 3 mm in size. These polyps                            were removed with a cold snare. Resection and                            retrieval were complete. The pathology specimen was                            placed into Bottle Number 2. Estimated blood loss                            was minimal.                           The exam was otherwise normal throughout the                            examined colon.                           The retroflexed view of the distal rectum and anal                            verge was normal and showed no anal or rectal                            abnormalities. Complications:            No immediate complications. Estimated Blood Loss:     Estimated blood loss was minimal. Impression:               - Three 2 to 3 mm polyps in the ascending colon,                            removed with a cold snare. Complete resection.  Partial retrieval.                           - Two 2 to 6 mm polyps at the hepatic flexure,                            removed with a cold snare.  Resected and retrieved.                           - Two 3 to 4 mm polyps in the transverse colon,                            removed with a cold snare. Resected and retrieved.                           - Two 3 to 6 mm polyps in the descending colon,                            removed with a cold snare. Resected and retrieved.                           - Two 2 to 3 mm polyps in the sigmoid colon,                            removed with a cold snare. Resected and retrieved.                           - The distal rectum and anal verge are normal on                            retroflexion view. Recommendation:           - Patient has a contact number available for                            emergencies. The signs and symptoms of potential                            delayed complications were discussed with the                            patient. Return to normal activities tomorrow.                            Written discharge instructions were provided to the                            patient.                           - Resume previous diet.                           - Continue present medications.                           -  Await pathology results.                           - Repeat colonoscopy (date not yet determined) for                            surveillance based on pathology results. Bernece Gall E. Candis Schatz, MD 06/09/2022 12:49:50 PM This report has been signed electronically.

## 2022-06-10 ENCOUNTER — Telehealth: Payer: Self-pay

## 2022-06-10 NOTE — Telephone Encounter (Signed)
  Follow up Call-     06/09/2022   11:26 AM  Call back number  Post procedure Call Back phone  # [phone]  Permission to leave phone message Yes     Patient questions:  Do you have a fever, pain , or abdominal swelling? No. Pain Score  0 *  Have you tolerated food without any problems? Yes.    Have you been able to return to your normal activities? Yes.    Do you have any questions about your discharge instructions: Diet   No. Medications  No. Follow up visit  No.  Do you have questions or concerns about your Care? No.  Actions: * If pain score is 4 or above: No action needed, pain <4.

## 2022-06-17 NOTE — Progress Notes (Signed)
Mackenzie Garrett,   Seven polyps that I removed during your recent procedure were completely benign but were proven to be "pre-cancerous" polyps that MAY have grown into cancers if they had not been removed.  Three polyps in the left colon were hyperplastic polyps, which not considered precancerous.  One small polyp was removed but was not retrieved.  Studies shows that at least 20% of women over age 61 and 30% of men over age 32 have pre-cancerous polyps.  Based on current nationally recognized surveillance guidelines, I recommend that you have a repeat colonoscopy in 3 years.   If you develop any new rectal bleeding, abdominal pain or significant bowel habit changes, please contact me before then.

## 2023-01-05 ENCOUNTER — Other Ambulatory Visit: Payer: Self-pay | Admitting: Student

## 2023-01-05 DIAGNOSIS — M5136 Other intervertebral disc degeneration, lumbar region: Secondary | ICD-10-CM

## 2023-01-05 DIAGNOSIS — M51369 Other intervertebral disc degeneration, lumbar region without mention of lumbar back pain or lower extremity pain: Secondary | ICD-10-CM

## 2023-02-17 ENCOUNTER — Ambulatory Visit
Admission: RE | Admit: 2023-02-17 | Discharge: 2023-02-17 | Disposition: A | Discharge status: Home / Self Care | Payer: Medicare Other | Source: Ambulatory Visit | Attending: Student | Admitting: Student

## 2023-02-17 DIAGNOSIS — M5136 Other intervertebral disc degeneration, lumbar region: Secondary | ICD-10-CM

## 2023-11-27 ENCOUNTER — Institutional Professional Consult (permissible substitution): Admitting: Neurology
